# Patient Record
Sex: Male | Born: 1990 | Race: Black or African American | Hispanic: No | Marital: Single | Smoking: Never smoker
Health system: Southern US, Community
[De-identification: ages and names within clinical notes are randomized; demographics above are authoritative.]

## PROBLEM LIST (undated history)

## (undated) DIAGNOSIS — R140 Abdominal distension (gaseous): Secondary | ICD-10-CM

## (undated) DIAGNOSIS — F913 Oppositional defiant disorder: Secondary | ICD-10-CM

## (undated) DIAGNOSIS — F909 Attention-deficit hyperactivity disorder, unspecified type: Secondary | ICD-10-CM

## (undated) DIAGNOSIS — K580 Irritable bowel syndrome with diarrhea: Secondary | ICD-10-CM

## (undated) DIAGNOSIS — L309 Dermatitis, unspecified: Secondary | ICD-10-CM

## (undated) HISTORY — DX: Oppositional defiant disorder: F91.3

## (undated) HISTORY — DX: Attention-deficit hyperactivity disorder, unspecified type: F90.9

## (undated) HISTORY — DX: Abdominal distension (gaseous): R14.0

## (undated) HISTORY — DX: Dermatitis, unspecified: L30.9

## (undated) HISTORY — DX: Irritable bowel syndrome with diarrhea: K58.0

---
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
## 2009-12-22 ENCOUNTER — Ambulatory Visit: Payer: Self-pay | Admitting: Family Medicine

## 2011-03-12 ENCOUNTER — Encounter: Payer: Self-pay | Admitting: Family Medicine

## 2011-11-03 ENCOUNTER — Encounter: Payer: Self-pay | Admitting: Medical

## 2011-11-03 ENCOUNTER — Other Ambulatory Visit: Payer: Self-pay | Admitting: Medical

## 2011-11-03 ENCOUNTER — Ambulatory Visit (INDEPENDENT_AMBULATORY_CARE_PROVIDER_SITE_OTHER): Payer: BC Managed Care – PPO | Admitting: Medical

## 2011-11-03 VITALS — BP 110/80 | HR 88 | Temp 98.2°F | Resp 16 | Wt 138.0 lb

## 2011-11-03 DIAGNOSIS — Z113 Encounter for screening for infections with a predominantly sexual mode of transmission: Secondary | ICD-10-CM

## 2011-11-03 DIAGNOSIS — R531 Weakness: Secondary | ICD-10-CM

## 2011-11-03 DIAGNOSIS — R5383 Other fatigue: Secondary | ICD-10-CM

## 2011-11-03 DIAGNOSIS — R202 Paresthesia of skin: Secondary | ICD-10-CM

## 2011-11-03 DIAGNOSIS — R631 Polydipsia: Secondary | ICD-10-CM

## 2011-11-03 DIAGNOSIS — R209 Unspecified disturbances of skin sensation: Secondary | ICD-10-CM

## 2011-11-03 LAB — POCT URINALYSIS DIPSTICK
Blood, UA: NEGATIVE
Nitrite, UA: NEGATIVE
pH, UA: 5

## 2011-11-03 LAB — CBC WITH DIFFERENTIAL/PLATELET
Eosinophils Relative: 4 % (ref 0–5)
HCT: 45.9 % (ref 39.0–52.0)
Hemoglobin: 16 g/dL (ref 13.0–17.0)
Lymphocytes Relative: 21 % (ref 12–46)
Lymphs Abs: 2 10*3/uL (ref 0.7–4.0)
MCV: 82.4 fL (ref 78.0–100.0)
Monocytes Absolute: 0.9 10*3/uL (ref 0.1–1.0)
Monocytes Relative: 9 % (ref 3–12)
RBC: 5.57 MIL/uL (ref 4.22–5.81)
WBC: 9.4 10*3/uL (ref 4.0–10.5)

## 2011-11-03 LAB — COMPREHENSIVE METABOLIC PANEL
Alkaline Phosphatase: 60 U/L (ref 39–117)
Creat: 1.13 mg/dL (ref 0.50–1.35)
Glucose, Bld: 94 mg/dL (ref 70–99)
Sodium: 138 mEq/L (ref 135–145)
Total Bilirubin: 0.8 mg/dL (ref 0.3–1.2)
Total Protein: 8.1 g/dL (ref 6.0–8.3)

## 2011-11-03 LAB — VITAMIN B12: Vitamin B-12: 502 pg/mL (ref 211–911)

## 2011-11-03 NOTE — Progress Notes (Signed)
Subjective: Here for multiple concerns.  He is worried about changes and symptoms he has been experiencing in the last month.  He notes increased thirst, decreased urine output, night sweats, mouth gets dry, muscles feel weak, gets shaking at times, feels off balance and dizzy, gets tingling in his toes, blurred vision, and focus seems off.   He normally grazes throughout the day, but at least 2 meals daily.  He has lost weight unexpectedly.  He owns a pressure washing business.  Other people have commented on him not being himself, looking fatigued or tired.   He is concerned as their are family members with diabetes and Lupus.    He is sexually active, has multiple partners.  Last STD screening "a while ago." denies penile discharge, sores, pain, urinary burning.    Past Medical History  Diagnosis Date  . ADHD (attention deficit hyperactivity disorder)   . Eczema   . Oppositional defiant disorder    Family medical history: Maternal GM with lupus, maternal aunt and uncle with diabetes.  Parents are healthy.  He is an only child.   Review of Systems Constitutional: +fever, -chills, -sweats, +unexpected weight loss, +anorexia, +fatigue Allergy: -rash, -sneezing, -itching, -congestion Dermatology: denies changing moles, rash, lumps, new worrisome lesions ENT: -runny nose, -ear pain, -sore throat, -hoarseness, -sinus pain, -teeth pain, -tinnitus, -hearing loss, -epistaxis Cardiology:  -chest pain, -palpitations, -edema, -orthopnea, -paroxysmal nocturnal dyspnea Respiratory: some throat clearing; -cough, -shortness of breath, -dyspnea on exertion, -wheezing, -hemoptysis Gastroenterology: -abdominal pain, -nausea, -vomiting, +occasional diarrhea, -constipation, -blood in stool, -changes in bowel movement, -dysphagia Hematology: -bleeding or bruising problems Musculoskeletal: -arthralgias, -myalgias, -joint swelling, +low back pain, -neck pain, -cramping, -gait changes Ophthalmology: +vision  changes, -eye redness, -itching, -discharge Urology: +decreased urination, -dysuria, -difficulty urinating, -hematuria, -urinary frequency, -urgency, incontinence Neurology: -headache, -weakness, -tingling, +some numbness in toes, -speech abnormality, -memory loss, -falls, -dizziness Psychology:  +some temper of recent; -depressed mood, -agitation, -sleep problems   The following portions of the patient's history were reviewed and updated as appropriate: allergies, current medications, past family history, past medical history, past social history, past surgical history and problem list.    Objective:   Physical Exam  General appearance: alert, no distress, WD/WN, lean AA male Skin: numerous tattoos on abdomen and arms, no worrisome lesions HEENT: normocephalic, sclerae anicteric, TMs pearly, nares patent, no discharge or erythema, pharynx normal Oral cavity: MMM, no lesions Neck: supple, no lymphadenopathy, no thyromegaly, no masses Heart: RRR, normal S1, S2, no murmurs Lungs: CTA bilaterally, no wheezes, rhonchi, or rales Abdomen: +bs, soft, non tender, non distended, no masses, no hepatomegaly, no splenomegaly Pulses: 2+ symmetric, upper and lower extremities, normal cap refill Neuro: CN2-12 intact, normal sensation, strength and DTRs, nonfocal exam, no muscle fatigability GU: normal male genitalia, small white penile discharge, otherwise no mass, hernia, or skin lesions   Assessment and Plan :     Encounter Diagnoses  Name Primary?  . Fatigue Yes  . Weakness   . Polydipsia   . Paresthesia   . Screen for STD (sexually transmitted disease)    Advised that his symptoms are numerous and could represent a variety of processes.   We will check labs today to help evaluate and rule out several things hopefully.  I did advise though that given multiple sexual partners and the obvious penile discharge, he potentially has an STD.   We will call with labs and plan.

## 2011-11-04 ENCOUNTER — Other Ambulatory Visit: Payer: Self-pay | Admitting: Medical

## 2011-11-04 DIAGNOSIS — R7989 Other specified abnormal findings of blood chemistry: Secondary | ICD-10-CM

## 2011-11-04 LAB — GC/CHLAMYDIA PROBE AMP, GENITAL
Chlamydia, DNA Probe: NEGATIVE
GC Probe Amp, Genital: NEGATIVE

## 2011-11-04 LAB — T3: T3, Total: 82.5 ng/dL (ref 80.0–204.0)

## 2011-11-04 LAB — RPR

## 2011-11-04 NOTE — Progress Notes (Signed)
Addended by: Leretha Dykes L on: 11/04/2011 11:08 AM   Modules accepted: Orders

## 2011-11-05 ENCOUNTER — Encounter: Payer: Self-pay | Admitting: Family Medicine

## 2011-11-05 ENCOUNTER — Ambulatory Visit
Admission: RE | Admit: 2011-11-05 | Discharge: 2011-11-05 | Disposition: A | Discharge status: Home / Self Care | Payer: BC Managed Care – PPO | Source: Ambulatory Visit | Attending: Family Medicine | Admitting: Family Medicine

## 2011-11-05 ENCOUNTER — Ambulatory Visit (INDEPENDENT_AMBULATORY_CARE_PROVIDER_SITE_OTHER): Payer: BC Managed Care – PPO | Admitting: Family Medicine

## 2011-11-05 VITALS — BP 110/70 | HR 120

## 2011-11-05 DIAGNOSIS — R509 Fever, unspecified: Secondary | ICD-10-CM

## 2011-11-05 DIAGNOSIS — R5383 Other fatigue: Secondary | ICD-10-CM

## 2011-11-05 DIAGNOSIS — R5381 Other malaise: Secondary | ICD-10-CM

## 2011-11-05 DIAGNOSIS — R634 Abnormal weight loss: Secondary | ICD-10-CM

## 2011-11-05 DIAGNOSIS — R946 Abnormal results of thyroid function studies: Secondary | ICD-10-CM

## 2011-11-05 DIAGNOSIS — R61 Generalized hyperhidrosis: Secondary | ICD-10-CM

## 2011-11-05 NOTE — Progress Notes (Signed)
  Subjective:    Patient ID: Hunter Estes, male    DOB: 1990-08-24, 21 y.o.   MRN: [id number]  HPI He is here for recheck. He continues to have trouble with fatigue, night sweats , fever and chills, weight loss, some cough and congestion with occasional diarrhea. He states that now his symptoms started approximately 2-3 months ago with cough and have progressed since then. He states it is closer fitting him very loosely and in fact he has had to take several masses in his belt loop.   Review of Systems     Objective:   Physical Exam alert and in no distress. Tympanic membranes and canals are normal. Throat is clear. Tonsils are normal. Neck is supple without adenopathy or thyromegaly. Cardiac exam shows a regular sinus rhythm without murmurs or gallops. Lungs are clear to auscultation. No adenopathy noted in axilla or groin. Abdominal exam shows no masses or tenderness. Review of his record does indicate slightly diminished thyroid function. Urine microscopic did show renal tubular cells and occasional white cell. Prostate exam was normal.       Assessment & Plan:   1. Night sweats  DG Chest 2 View, TB Skin Test  2. Fever and chills  DG Chest 2 View  3. Weight loss  DG Chest 2 View  4. Fatigue  DG Chest 2 View  5. Abnormal thyroid function test  Thyroid peroxidase antibody

## 2011-11-06 LAB — URINE CULTURE: Organism ID, Bacteria: NO GROWTH

## 2011-11-08 LAB — THYROID PEROXIDASE ANTIBODY: Thyroperoxidase Ab SerPl-aCnc: <10 IU/mL (ref <35.0)

## 2011-11-09 ENCOUNTER — Encounter: Payer: Self-pay | Admitting: Family Medicine

## 2011-11-09 ENCOUNTER — Ambulatory Visit (INDEPENDENT_AMBULATORY_CARE_PROVIDER_SITE_OTHER): Payer: BC Managed Care – PPO | Admitting: Family Medicine

## 2011-11-09 VITALS — BP 120/80 | HR 74 | Wt 143.0 lb

## 2011-11-09 DIAGNOSIS — N419 Inflammatory disease of prostate, unspecified: Secondary | ICD-10-CM

## 2011-11-09 LAB — TB SKIN TEST
Induration: 72 mm
TB Skin Test: NEGATIVE

## 2011-11-09 MED ORDER — DOXYCYCLINE HYCLATE 100 MG PO TABS: 100.0000 mg | ORAL_TABLET | Freq: Two times a day (BID) | ORAL | Status: AC

## 2011-11-09 NOTE — Progress Notes (Signed)
  Subjective:    Patient ID: Hunter Estes, male    DOB: 12-Dec-1990, 21 y.o.   MRN: [id number]  HPI He is here for recheck. He states that his appetite is slightly better but he still having some difficulty with urinary decreased stream. He is also noted some erectile difficulties. On his previous evaluation a slight discharge was noted although the [facility] including culture was negative. His prostate exam was equivocal.   Review of Systems     Objective:   Physical Exam Alert and in no distress. Genital exam shows normal penis and testes with no discharge.      Assessment & Plan:   1. Prostatitis    his symptoms are very vague but could possibly be a low-grade prostatitis causing some this. I decided to treat him with doxycycline. He will let me know if this helps his symptoms.

## 2011-11-09 NOTE — Patient Instructions (Signed)
Call me back in 10 days and let me know how you're doing

## 2011-11-22 ENCOUNTER — Encounter: Payer: Self-pay | Admitting: Family Medicine

## 2011-11-22 ENCOUNTER — Ambulatory Visit (INDEPENDENT_AMBULATORY_CARE_PROVIDER_SITE_OTHER): Payer: BC Managed Care – PPO | Admitting: Family Medicine

## 2011-11-22 VITALS — BP 118/80 | HR 82 | Wt 146.0 lb

## 2011-11-22 DIAGNOSIS — R531 Weakness: Secondary | ICD-10-CM

## 2011-11-22 DIAGNOSIS — R5381 Other malaise: Secondary | ICD-10-CM

## 2011-11-22 NOTE — Progress Notes (Signed)
  Subjective:    Patient ID: Hunter Estes, male    DOB: [birth year], 21 y.o.   MRN: [id number]  HPI He is here for recheck. He states he still not quite back to his normal self. He has noted some blurred vision mainly left eye that tends to come and go. He does complain of continued difficulty with shaking in his hands as well as a numb feeling in his toes bilaterally. He also complains of generalized weakness. He also complains of continued difficulty with erections. He also states his stream seems to be weaker.   Review of Systems     Objective:   Physical Exam alert and in no distress. Tympanic membranes and canals are normal. Throat is clear. Tonsils are normal. Neck is supple without adenopathy or thyromegaly. Cardiac exam shows a regular sinus rhythm without murmurs or gallops. Lungs are clear to auscultation. DTRs normal. No hand tremor noted.        Assessment & Plan:  The etiology of his symptoms is unclear. I will refer to rheumatology

## 2011-12-07 ENCOUNTER — Telehealth: Payer: Self-pay | Admitting: Family Medicine

## 2011-12-07 NOTE — Telephone Encounter (Signed)
Pt informed and faxed info over to Deveshwar at [facility] [phone] fax [phone]

## 2011-12-07 NOTE — Telephone Encounter (Signed)
Send him to a rheumatologist

## 2011-12-17 ENCOUNTER — Encounter: Payer: Self-pay | Admitting: Medical

## 2011-12-17 ENCOUNTER — Ambulatory Visit (INDEPENDENT_AMBULATORY_CARE_PROVIDER_SITE_OTHER): Payer: BC Managed Care – PPO | Admitting: Medical

## 2011-12-17 VITALS — BP 122/88 | HR 88 | Temp 98.2°F | Resp 16 | Wt 141.0 lb

## 2011-12-17 DIAGNOSIS — S81029A Laceration with foreign body, unspecified knee, initial encounter: Secondary | ICD-10-CM

## 2011-12-17 DIAGNOSIS — S81809A Unspecified open wound, unspecified lower leg, initial encounter: Secondary | ICD-10-CM

## 2011-12-17 MED ORDER — AMOXICILLIN-POT CLAVULANATE 875-125 MG PO TABS: 1.0000 | ORAL_TABLET | Freq: Two times a day (BID) | ORAL | Status: AC
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
## 2011-12-24 ENCOUNTER — Ambulatory Visit (INDEPENDENT_AMBULATORY_CARE_PROVIDER_SITE_OTHER): Payer: BC Managed Care – PPO | Admitting: Medical

## 2011-12-24 ENCOUNTER — Ambulatory Visit: Payer: BC Managed Care – PPO | Admitting: Medical

## 2011-12-24 ENCOUNTER — Encounter: Payer: Self-pay | Admitting: Medical

## 2011-12-24 VITALS — BP 120/86 | HR 80 | Temp 98.6°F | Resp 18 | Wt 142.0 lb

## 2011-12-24 DIAGNOSIS — S81019A Laceration without foreign body, unspecified knee, initial encounter: Secondary | ICD-10-CM

## 2011-12-24 DIAGNOSIS — Z4802 Encounter for removal of sutures: Secondary | ICD-10-CM

## 2011-12-24 DIAGNOSIS — S91009A Unspecified open wound, unspecified ankle, initial encounter: Secondary | ICD-10-CM

## 2011-12-24 DIAGNOSIS — S81009A Unspecified open wound, unspecified knee, initial encounter: Secondary | ICD-10-CM

## 2011-12-24 NOTE — Progress Notes (Signed)
Subjective: Here for suture removal and recheck.  I saw him on 12/17/11 for wound to both knees, fell on glass.  We cleaned the knees, irrigated the wounds, and I placed 3 sutures.  Here is taking the antibiotic, no additional pus drainage, no fever, feeling fine.  The knees both have scabs now  Objective: Gen: wd, wn Skin: left knee with healing abrasions, scabs present, right knee similarly healing, with serosanguinous crust at sites of prior lacerations.  The 3 stellate lesions have healed back nicely with no open wounds or drainage currently.  1 interrupted suture present at each of the 3 stellate wounds of the right knee  Assessment: Encounter Diagnoses  Name Primary?  . Laceration of knee Yes  . Visit for suture removal     Plan: Used saline gauze to massage the scabs over the wounds as the sutures were buried in the crusting.   Then cleaned area with betadine and removed the 3 interrupted sutures.  Wounds healing appropriately.  Advised he finish antibiotic, discussed ongoing wound care until they completley heal.

## 2012-01-27 ENCOUNTER — Telehealth: Payer: Self-pay | Admitting: Family Medicine

## 2012-01-27 NOTE — Telephone Encounter (Signed)
LM

## 2012-02-02 ENCOUNTER — Ambulatory Visit (INDEPENDENT_AMBULATORY_CARE_PROVIDER_SITE_OTHER): Payer: BC Managed Care – PPO | Admitting: Family Medicine

## 2012-02-02 ENCOUNTER — Encounter: Payer: Self-pay | Admitting: Family Medicine

## 2012-02-02 VITALS — BP 124/80 | HR 74 | Wt 150.0 lb

## 2012-02-02 DIAGNOSIS — F39 Unspecified mood [affective] disorder: Secondary | ICD-10-CM

## 2012-02-02 DIAGNOSIS — R4586 Emotional lability: Secondary | ICD-10-CM

## 2012-02-02 NOTE — Patient Instructions (Signed)
Call Dr. Andee Poles [phone] and set up an appointment. Let me know when it is and I will send my information to her.

## 2012-02-02 NOTE — Progress Notes (Signed)
  Subjective:    Patient ID: Hunter Estes, male    DOB: Oct 04, 1990, 21 y.o.   MRN: [id number]  HPI He is here for followup visit. He was seen by Dr. Corliss Skains recently. She did call me concerning his care. Her evaluation was essentially negative. He is here for a recheck. He complains of multiple issues including intermittent blurred vision, a one-month history of noting an increased heart rate with some chest pain that is intermittent as well. He did complain of weight loss but says that recently he has gained his weight back. He also states that he sweats quite easily. He's noted some difficulty with urinary dripping. He also notes questionable hair loss as well as symptoms of gastrocolic reflex. He has a previous history of ADHD and oppositional defiant disorder. At the end of the encounter he then mentioned that over the last month or so his mother and girlfriend are both noted mood swings. He then stated that his moods can swing from minute to minute.   Review of Systems     Objective:   Physical Exam Alert and in no distress with appropriate affect. DTRs are normal. EOMI. Cerebellar normal. Normal finger to nose.       Assessment & Plan:   1. Mood swings    I discussed the fact that his symptoms are not consistent with anything in particular and psychological is certainly an option. He was comfortable with this. He was given the phone number for Dr. Andee Poles and will let me know any has an appointment selected send her the information. He was comfortable with this. 30 minutes spent discussing this with him

## 2012-03-23 ENCOUNTER — Ambulatory Visit: Payer: BC Managed Care – PPO | Admitting: Family Medicine

## 2012-03-28 ENCOUNTER — Encounter: Payer: Self-pay | Admitting: Family Medicine

## 2012-03-28 ENCOUNTER — Ambulatory Visit (INDEPENDENT_AMBULATORY_CARE_PROVIDER_SITE_OTHER): Payer: BC Managed Care – PPO | Admitting: Family Medicine

## 2012-03-28 VITALS — BP 122/88 | HR 100 | Wt 146.0 lb

## 2012-03-28 DIAGNOSIS — H538 Other visual disturbances: Secondary | ICD-10-CM

## 2012-03-28 NOTE — Progress Notes (Signed)
  Subjective:    Patient ID: Hunter Estes, male    DOB: Oct 07, 1990, 21 y.o.   MRN: [id number]  HPI He is here for consultation concerning continued difficulty with rapid heart rate, difficulty urinating as well as having BM's, tremors in his hands, blurred vision mainly on the left. He also complains of abdominal distress. He has been seen by rheumatology    Review of Systems     Objective:   Physical Exam alert and in no distress with appropriate affect. Tympanic membranes and canals are normal. Throat is clear. Tonsils are normal. Neck is supple without adenopathy or thyromegaly. Cardiac exam shows a regular sinus rhythm without murmurs or gallops. Lungs are clear to auscultation. DTRs normal. No clonus noted.       Assessment & Plan:  The etiology of his symptoms is unclear. MS is a possibility as is psychological overlay. 1. Blurred vision, left eye  MR Brain W Wo Contrast

## 2012-03-28 NOTE — Progress Notes (Signed)
Pt has an appt with [facility] @ [address] [place] [address] on Thursday October 31,2013 @ 7:30pm for MRI. auth # [id number]

## 2012-03-30 ENCOUNTER — Other Ambulatory Visit: Payer: BC Managed Care – PPO

## 2012-04-03 ENCOUNTER — Ambulatory Visit
Admission: RE | Admit: 2012-04-03 | Discharge: 2012-04-03 | Disposition: A | Discharge status: Home / Self Care | Payer: BC Managed Care – PPO | Source: Ambulatory Visit | Attending: Family Medicine | Admitting: Family Medicine

## 2012-04-03 ENCOUNTER — Encounter: Payer: Self-pay | Admitting: Family Medicine

## 2012-04-03 DIAGNOSIS — H538 Other visual disturbances: Secondary | ICD-10-CM

## 2012-04-03 MED ORDER — GADOBENATE DIMEGLUMINE 529 MG/ML IV SOLN
14.0000 mL | Freq: Once | INTRAVENOUS | Status: AC | PRN
  Administered 2012-04-03: 14 mL via INTRAVENOUS

## 2012-04-04 NOTE — Progress Notes (Signed)
Quick Note:  I called the patient. Schedule him to see neurosurgery. ______

## 2012-05-31 HISTORY — PX: OTHER SURGICAL HISTORY: SHX169

## 2012-06-13 ENCOUNTER — Telehealth: Payer: Self-pay | Admitting: Family Medicine

## 2012-06-13 NOTE — Telephone Encounter (Signed)
Called pt and informed him word for word and pt verbalized understanding

## 2012-06-13 NOTE — Telephone Encounter (Signed)
MOM MADE APPT ON 1/23 WITH DR Susann Givens DISCUSS THE POSSIBILITY OF PT HAVING MS BUT WANTS TO KNOW IF [facility] WILL TEST FOR THAT HERE IN THE OFFICE OR IF PT WILL HAVE TO GO SOMEWHERE ELSE FOR THAT TESTING. IF THEY WILL HAVE TO GO SOMEWHERE ELSE, IS THERE A POSSIBILITY THAT DR [facility]ON[facility] MAY SEE A NEED TO TEST FOR THIS BEFORE THAT APPOINTMENT SO THE RESULTS CAN BE DISCUSSED AT THE APPOINTMENT. OR DO THEY NEED TO GO TO NEUROLOGY?

## 2012-06-13 NOTE — Telephone Encounter (Signed)
Spleen to her that there really is not a blood test for MS. The MRI that he had is one way we evaluate for MS. We can discuss further options when he comes in.

## 2012-06-22 ENCOUNTER — Encounter: Payer: Self-pay | Admitting: Family Medicine

## 2012-06-22 ENCOUNTER — Ambulatory Visit (INDEPENDENT_AMBULATORY_CARE_PROVIDER_SITE_OTHER): Payer: BC Managed Care – PPO | Admitting: Family Medicine

## 2012-06-22 VITALS — BP 118/80 | HR 80 | Wt 152.0 lb

## 2012-06-22 DIAGNOSIS — R5383 Other fatigue: Secondary | ICD-10-CM

## 2012-06-22 DIAGNOSIS — R197 Diarrhea, unspecified: Secondary | ICD-10-CM

## 2012-06-22 DIAGNOSIS — R109 Unspecified abdominal pain: Secondary | ICD-10-CM

## 2012-06-22 NOTE — Progress Notes (Signed)
  Subjective:    Patient ID: Hunter Estes, male    DOB: 1990/11/28, 22 y.o.   MRN: [id number]  HPI He is here for followup visit. He has concerns over possibly having MS. He has had an evaluation by myself, rheumatology, neurology, neuro-ophthalmology. So far no particular diagnosis has been made for his underlying problem. Most recently he has had difficulty with abdominal pain, bloating, excessive flatulence with him diarrhea. He cannot relate this to any particular foods. He continues to complain of fatigue with intermittent palpitations.   Review of Systems     Objective:   Physical Exam Alert and in no distress otherwise not examined       Assessment & Plan:   1. Abdominal  pain, other specified site  Celiac panel  2. Fatigue  Celiac panel  3. Diarrhea  Celiac panel   so far the exact etiology behind all of his symptoms has been unobtainable. I will further assess his GI symptoms. May need to pursue further evaluation of his palpitations but not at the present time.

## 2012-06-23 NOTE — Progress Notes (Signed)
Quick Note:  Have him set up an appointment to discuss celiac disease ______

## 2012-06-23 NOTE — Progress Notes (Signed)
Quick Note:  PT HAS APT JAN 28 TO COME IN AND DISCUSS LABS ______

## 2012-06-27 ENCOUNTER — Ambulatory Visit (INDEPENDENT_AMBULATORY_CARE_PROVIDER_SITE_OTHER): Payer: BC Managed Care – PPO | Admitting: Family Medicine

## 2012-06-27 DIAGNOSIS — K900 Celiac disease: Secondary | ICD-10-CM

## 2012-06-27 NOTE — Progress Notes (Signed)
  Subjective:    Patient ID: Hunter Estes, male    DOB: 1990-10-25, 22 y.o.   MRN: [id number]  HPI He is here for consultation concerning recent lab work which did show evidence of celiac disease. His most recent visit did demonstrate GI symptoms. Prior to this he did have a plethora of other symptoms that might possibly be related to this.   Review of Systems     Objective:   Physical Exam Alert and in no distress otherwise not examined       Assessment & Plan:   1. Celiac disease    information concerning celiac disease was given to him. Is also recommended that he go to various websites to get more information concerning celiac disease. I also discussed the fact that some of the other symptoms and perhaps all of the previous symptoms that when he was being evaluated for a potentially go away. He is to return here in one month for recheck. 15 minutes spent discussing all these issues with him.

## 2012-06-27 NOTE — Patient Instructions (Signed)
Celiac Disease Celiac disease is a digestive disease that causes your body's natural defense system (immune system) to react against its own cells. It interferes with taking in (absorbing) nutrients from food. Celiac disease is also known as celiac sprue, nontropical sprue, and gluten-sensitive enteropathy. People who have celiac disease cannot tolerate gluten. Gluten is a protein found in wheat, rye, and barley. With time, celiac disease will damage the cells lining the small intestine. This leads to being unable to absorb nutrients from food (malabsorption), diarrhea, and nutritional problems. CAUSES  Celiac disease is genetic. This means you have a higher likelihood of getting the disease if someone in your family has or has had it. Up to 10% of your close relatives (parent, sibling, child) may also have the disease.  People with celiac disease tend to have other autoimmune diseases. The link may be genetic. These diseases include:  Dermatitis herpetiformis.  Thyroid disease.  Systemic lupus erythematosis.  Type 1 diabetes.  Liver disease.  Collagen vascular disease.  Rheumatoid arthritis.  Sjgren's syndrome. SYMPTOMS  The symptoms of celiac disease vary from person to person. The symptoms are generally digestive or nutritional. Digestive symptoms include:  Recurring belly (abdominal) bloating and pain.  Gas.  Long-term (chronic) diarrhea.  Pale, bad-smelling, greasy, or oily stool. Nutritional symptoms include:  Failure to thrive in infants.  Delayed growth in children.  Weight loss in children and adults.  Missed menstrual periods (often due to extreme weight loss).  Anemia.  Weakening bones (osteoporosis).  Fatigue and weakness.  Tingling or other signs of nerve damage (peripheral neuropathy).  Depression. DIAGNOSIS  If your symptoms and physical exam suggest that a digestive disorder or malnutrition is present, your caregiver may suspect celiac disease. You  may have already begun a gluten-free diet. If symptoms persist, testing may be needed to confirm the diagnosis. Some tests are best done while you are on a normal, unrestricted diet. Tests may include:  Blood tests to check for nutritional deficiencies.  Blood tests to look for evidence that the body is producing antibodies against its own small intestine cells.  Taking a tissue sample (biopsy) from the small bowel for evaluation.  X-rays of the small bowel.  Evaluating the stool for fat.  Tests to check for nutrient absorption from the intestine. TREATMENT  It is important to seek treatment. Untreated celiac disease can cause growth problems (in children), anemia, osteoporosis, and possible nerve problems. A pregnant patient with untreated celiac disease has a higher risk of miscarriage, and the fetus has an increased risk of low birth weight and other growth problems. If celiac disease is diagnosed in the early stages, treatment can allow you to live a long, nearly symptom-free life. Treatment includes following a gluten-free diet. This means avoiding all foods that contain gluten. Eating even a small amount of gluten can damage your intestine. For most people, following this diet will stop symptoms. It will heal existing intestinal damage and prevent further damage. Improvements begin within days of starting the diet. The small intestine is usually completely healed within 3 to 6 months, or it may take up to 2 years for older adults. A small percentage of people do not improve on the gluten-free diet. Depending on your age and the stage at which you were diagnosed, some problems such as delayed growth and discolored teeth may not improve. Sometimes, damaged intestines cannot heal. If your intestines are not absorbing enough nutrients, you may need to receive nutrition supplements through an intravenous (IV) tube.  Drug treatments are being tested for unresponsive celiac disease. In this case, you  may need to be evaluated for complications of the disease. Your caregiver may also recommend:  A pneumonia vaccination.  Nutrients and other treatments for any nutritional deficiencies. Your caregiver can provide you with more information on a gluten-free diet. Discussion with a dietician skilled in this illness will be valuable. Support groups may also be helpful. HOME CARE INSTRUCTIONS   Focus on a gluten-free diet. This diet must become a way of life.  Monitor your response to the gluten-free diet and treat any nutritional deficiencies.  Prepare ahead of time if you decide to eat outside the home.  Make and keep your regular follow-up visits with your caregiver.  Suggest to family members that they get screened for early signs of the disease. SEEK MEDICAL CARE IF:   You continue to have digestive symptoms (gas, cramping, diarrhea) despite a proper diet.  You have trouble sticking to the gluten-free diet.  You develop an itchy rash with groups of tiny blisters.  You develop severe weakness, balance problems, menstrual problems, or depression. Document Released: 05/17/2005 Document Revised: 08/09/2011 Document Reviewed: 09/03/2009 [facility] Patient Information 2013 [place], [place]. Gluten intolerance a group.

## 2012-09-12 ENCOUNTER — Telehealth: Payer: Self-pay | Admitting: Family Medicine

## 2012-09-12 ENCOUNTER — Encounter: Payer: Self-pay | Admitting: Medical

## 2012-09-12 ENCOUNTER — Ambulatory Visit (INDEPENDENT_AMBULATORY_CARE_PROVIDER_SITE_OTHER): Payer: BC Managed Care – PPO | Admitting: Medical

## 2012-09-12 VITALS — BP 120/80 | HR 68 | Temp 98.4°F | Resp 16 | Wt 146.0 lb

## 2012-09-12 DIAGNOSIS — S62339A Displaced fracture of neck of unspecified metacarpal bone, initial encounter for closed fracture: Secondary | ICD-10-CM

## 2012-09-12 DIAGNOSIS — S6991XA Unspecified injury of right wrist, hand and finger(s), initial encounter: Secondary | ICD-10-CM

## 2012-09-12 DIAGNOSIS — S6990XA Unspecified injury of unspecified wrist, hand and finger(s), initial encounter: Secondary | ICD-10-CM

## 2012-09-12 DIAGNOSIS — S62309A Unspecified fracture of unspecified metacarpal bone, initial encounter for closed fracture: Secondary | ICD-10-CM

## 2012-09-12 NOTE — Telephone Encounter (Signed)
Message copied by Liara Holm L on Tue Sep 12, 2012  4:28 PM ------      Message from: Aleen Campi, DAVID S      Created: Tue Sep 12, 2012  3:13 PM       pls refer to whoever can get him in today for clinical dx of right hand boxer's fracture.   They can do xrays, as I am 99% certain based on clinical finding of a fracture.  ------
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
## 2012-09-12 NOTE — Telephone Encounter (Signed)
Patient is aware to go to the after hours clinic for [facility] hours 530 to 9 pm [phone]. CLS

## 2012-10-05 ENCOUNTER — Ambulatory Visit (INDEPENDENT_AMBULATORY_CARE_PROVIDER_SITE_OTHER): Payer: BC Managed Care – PPO | Admitting: Family Medicine

## 2012-10-05 ENCOUNTER — Encounter: Payer: Self-pay | Admitting: Family Medicine

## 2012-10-05 VITALS — BP 110/80 | HR 80 | Wt 147.0 lb

## 2012-10-05 DIAGNOSIS — K900 Celiac disease: Secondary | ICD-10-CM

## 2012-10-05 DIAGNOSIS — R002 Palpitations: Secondary | ICD-10-CM

## 2012-10-05 DIAGNOSIS — K5900 Constipation, unspecified: Secondary | ICD-10-CM

## 2012-10-05 MED ORDER — POLYETHYLENE GLYCOL 3350 17 GM/SCOOP PO POWD: 17.0000 g | Freq: Two times a day (BID) | ORAL | Status: DC | PRN

## 2012-10-05 NOTE — Patient Instructions (Signed)
Use this medication daily for the next week or 2 to help make you more regular. If this does not work,I will get you an appointment with the stomach specialist

## 2012-10-05 NOTE — Progress Notes (Signed)
  Subjective:    Patient ID: Hunter Estes, male    DOB: 1991/04/05, 21 y.o.   MRN: [id number]  HPI He has a two-week history of intermittent lower abdominal pain that occurs every 2 or 3 days. There is no association with urinary or bowel habits.  He changed his eating habits in January when the diagnosis of celiac disease was made and since then his bowel habits have decreased now having 2 per week and he states they are quite hard. He says that he has a hard time evacuating. He also continues to have difficulty with palpitations but these are separate and have been going on for several months.   Review of Systems     Objective:   Physical Exam Alert and in no distress. Cardiac and lung exam were not done. Abdominal exam showed active bowel sounds without masses and some lower abdominal tenderness. Rectal exam showed firm stool present which was guaiac negative       Assessment & Plan:  Constipation - Plan: polyethylene glycol powder (GLYCOLAX/MIRALAX) powder  Palpitations - Plan: EKG 12-Lead recommend plenty of fluids as well as a MiraLax. If continued difficulty, GI evaluation will be made. No particular evaluation a workup for palpitations at this time. Celiac disease

## 2012-10-16 ENCOUNTER — Telehealth: Payer: Self-pay

## 2012-10-16 NOTE — Telephone Encounter (Signed)
MOM CALLED AND SAID PT WAS DX WITH CELIAC AND WAS WANTING REFERRAL TO GI FOR THIS IS THIS OK PLEASE ADVISE

## 2012-10-16 NOTE — Telephone Encounter (Signed)
Not sure why he wants a referral. If he is doing well, nothing else needs to be done. There is no cure just avoidance. Find out what his concern is.

## 2012-10-29 HISTORY — PX: COLONOSCOPY: SHX174

## 2012-11-02 ENCOUNTER — Encounter: Payer: Self-pay | Admitting: Internal Medicine

## 2012-11-02 LAB — HM COLONOSCOPY: HM Colonoscopy: NORMAL

## 2012-11-07 ENCOUNTER — Telehealth: Payer: Self-pay | Admitting: Internal Medicine

## 2012-11-07 NOTE — Telephone Encounter (Signed)
Pt mom calling stating she would like a referral to a cardiologist. Pt is having weakness,chest pain,slow heart rate, palpalations,

## 2012-11-07 NOTE — Telephone Encounter (Signed)
Let him know that he needs to come in here first to get some limited data run.

## 2012-11-09 ENCOUNTER — Ambulatory Visit: Payer: BC Managed Care – PPO | Admitting: Family Medicine

## 2012-11-17 ENCOUNTER — Telehealth: Payer: Self-pay | Admitting: Family Medicine

## 2012-11-17 NOTE — Telephone Encounter (Signed)
LM

## 2012-11-20 ENCOUNTER — Ambulatory Visit (INDEPENDENT_AMBULATORY_CARE_PROVIDER_SITE_OTHER): Payer: BC Managed Care – PPO | Admitting: Family Medicine

## 2012-11-20 DIAGNOSIS — R002 Palpitations: Secondary | ICD-10-CM

## 2012-11-20 NOTE — Progress Notes (Signed)
  Subjective:    Patient ID: Hunter Estes, male    DOB: 07/03/1990, 22 y.o.   MRN: [id number]  HPI He complains of difficulty over the last 3 months with rapid heart rate and that sometimes he notes a slow heart rate. He also complains of chest pain and not relate this to the heart rate. One month ago while on a plane flight he became short of breath and clammy as well as having visual changes in an up and having, vision, passed out.he has had one other episode like this in both times he had a Bacardi rum prior to it.   Review of Systems     Objective:   Physical Exam Alert and in no distress. Cardiac exam shows a regular rhythm without murmurs or gallops. Lungs are clear to auscultation       Assessment & Plan:  Palpitations - Plan: Holter monitor - 24 hour

## 2012-11-23 ENCOUNTER — Ambulatory Visit: Payer: BC Managed Care – PPO | Admitting: *Deleted

## 2012-11-23 DIAGNOSIS — R002 Palpitations: Secondary | ICD-10-CM

## 2012-11-23 DIAGNOSIS — R0602 Shortness of breath: Secondary | ICD-10-CM

## 2012-11-23 NOTE — Progress Notes (Signed)
(  Dr. Susann Givens referral only)24 hour holter monitor applied. Verbal instructions given to patient. Patient and mother voiced understanding for the instructions, and left without questions.

## 2013-02-06 ENCOUNTER — Ambulatory Visit (INDEPENDENT_AMBULATORY_CARE_PROVIDER_SITE_OTHER): Payer: BC Managed Care – PPO | Admitting: Family Medicine

## 2013-02-06 VITALS — BP 120/80 | HR 85 | Wt 154.0 lb

## 2013-02-06 DIAGNOSIS — G4710 Hypersomnia, unspecified: Secondary | ICD-10-CM

## 2013-02-06 DIAGNOSIS — R002 Palpitations: Secondary | ICD-10-CM

## 2013-02-06 DIAGNOSIS — R079 Chest pain, unspecified: Secondary | ICD-10-CM

## 2013-02-06 DIAGNOSIS — R531 Weakness: Secondary | ICD-10-CM

## 2013-02-06 DIAGNOSIS — R5381 Other malaise: Secondary | ICD-10-CM

## 2013-02-06 LAB — CBC WITH DIFFERENTIAL/PLATELET
Basophils Absolute: 0 10*3/uL (ref 0.0–0.1)
Lymphocytes Relative: 31 % (ref 12–46)
Lymphs Abs: 1.9 10*3/uL (ref 0.7–4.0)
Neutro Abs: 3.2 10*3/uL (ref 1.7–7.7)
Neutrophils Relative %: 51 % (ref 43–77)
Platelets: 186 10*3/uL (ref 150–400)
RBC: 5.18 MIL/uL (ref 4.22–5.81)
RDW: 13.2 % (ref 11.5–15.5)
WBC: 6.1 10*3/uL (ref 4.0–10.5)

## 2013-02-06 LAB — COMPREHENSIVE METABOLIC PANEL
ALT: 13 U/L (ref 0–53)
AST: 16 U/L (ref 0–37)
CO2: 31 mEq/L (ref 19–32)
Calcium: 9.9 mg/dL (ref 8.4–10.5)
Chloride: 101 mEq/L (ref 96–112)
Creat: 1.07 mg/dL (ref 0.50–1.35)
Sodium: 138 mEq/L (ref 135–145)
Total Protein: 7.5 g/dL (ref 6.0–8.3)

## 2013-02-06 LAB — TSH: TSH: 0.899 u[IU]/mL (ref 0.350–4.500)

## 2013-02-06 NOTE — Progress Notes (Signed)
  Subjective:    Patient ID: Hunter Estes, male    DOB: 07-Nov-1990, 22 y.o.   MRN: [id number]  HPI He is here for evaluation of multiple issues. He has had difficulty with intermittent sharp chest pain and palpitations. He did have a Holter monitor however the result is not in the system. He also complains of intermittent left arm and leg pain that seem to be separate from each other. He describes weakness and numbness and then other times he can have pain. There is no history of injury. It can occur at any time. He also complains of excessive sleepiness now. He states that he is taking 3 naps per day. Since then occurring over the last several months. His record indicates he has had a plethora of other symptoms that have been evaluated and so far nothing has been identified. He has been seen by rheumatology, neurosurgery and by myself.   Review of Systems     Objective:   Physical Exam alert and in no distress. EOMI. Other cranial nerves grossly intact. Normal motor sensory and DTRs of his arms and legs. Normal cerebellar. Tympanic membranes and canals are normal. Throat is clear. Tonsils are normal. Neck is supple without adenopathy or thyromegaly. Cardiac exam shows a regular sinus rhythm without murmurs or gallops. Lungs are clear to auscultation. The Holter monitor was eventually retrieved and it was essentially negative.        Assessment & Plan:  Palpitations - Plan: CBC with Differential, Comprehensive metabolic panel, TSH  Chest pain  Weakness - Plan: CBC with Differential, Comprehensive metabolic panel, TSH, C-reactive protein, Sedimentation rate, Ambulatory referral to Neurology  Hypersomnia - Plan: CBC with Differential, Comprehensive metabolic panel, TSH, Ambulatory referral to Neurology  I explained that I could not Heasley all these symptoms and no one nice neat package. Explained that this could also be psychological. Discussed options. At this time our refer to neurology to  make sure not missing anything and then to psychiatry to look into this further.

## 2013-02-06 NOTE — Patient Instructions (Signed)
When you have palpitations, measure your heart rate and whether it is regular or irregular and then let me know

## 2013-02-07 NOTE — Progress Notes (Signed)
Quick Note:  PATIENT HAS BEEN INFORMED OF LABS AND APPOINTMENT WITH DR.JAFFE AT [facility] [address] AT 9 AM ______

## 2013-02-08 ENCOUNTER — Encounter: Payer: Self-pay | Admitting: Cardiology

## 2013-02-20 ENCOUNTER — Encounter: Payer: Self-pay | Admitting: Neurology

## 2013-02-20 ENCOUNTER — Ambulatory Visit (INDEPENDENT_AMBULATORY_CARE_PROVIDER_SITE_OTHER): Payer: BC Managed Care – PPO | Admitting: Neurology

## 2013-02-20 VITALS — BP 104/68 | HR 66 | Temp 98.1°F | Ht 68.0 in | Wt 147.0 lb

## 2013-02-20 DIAGNOSIS — R5381 Other malaise: Secondary | ICD-10-CM

## 2013-02-20 DIAGNOSIS — R259 Unspecified abnormal involuntary movements: Secondary | ICD-10-CM

## 2013-02-20 DIAGNOSIS — R251 Tremor, unspecified: Secondary | ICD-10-CM

## 2013-02-20 DIAGNOSIS — R531 Weakness: Secondary | ICD-10-CM

## 2013-02-20 NOTE — Patient Instructions (Addendum)
Your neurological exam is normal.  You do have a mild tremor in the hands but it does not seem something primarily neurological or consistent with a typical tremor.  I unfortunately do not have an explanation for your symptoms from a neurological perspective.

## 2013-02-20 NOTE — Progress Notes (Signed)
NEUROLOGY CONSULTATION NOTE  Hunter Estes MRN: [id number] DOB: [birth year]  Referring provider: Dr. Susann Givens Primary care provider: Dr. Susann Givens  Reason for consult:  Multiple symptomatology  HISTORY OF PRESENT ILLNESS: Hunter Estes is a 22 year old right-handed man who presents for evaluation of multiple symptoms.  Records and images were personally reviewed where available.    Onset started about a year ago.  He first noted "jitters" in the hands.  It can occur at any time, at rest or with action.  It occurs in either hand.  He does not notice any difference after having an alcoholic beverage.  It affects his ability to hold objects tightly.  Tremors do not run in the family.  He also notes jitters in the legs as well.  He also notes generalized weakness.  He says it involves all extremities, primarily distal (such as the calves).  He denies muscle pain.  He says he cannot stand too long, or else his legs start shaking and he sways.  He has not had any falls.  It is not worse any particular time of day and not worse with activity or inactivity.  He used to lift weights and can no longer participate.  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  He denies back pain shooting down the legs.    He also notes monocular blurred vision in the left eye.  There is no associated eye pain.  He also notes excessive somnolence during the day.  He sleep approximately 9-11 hours of uninterrupted sleep.  Sleep latency is short.  Other symptoms include paresthesia, palpitations, abdominal pain, shortness of breath and chest pain.    He had an MRI of the brain w/wo contrast performed on 03/28/12 for hand tremor and blurred vision of left eye.  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 Leanord Asal, neurosurgeon at [facility]  He was referred to neuro-ophthalmology, who did not find  any neuro-ophthalmic manifestations, although glaucoma was suspected.  It was determined by neurosurgery that his symptoms were not likely correlated with this lesion and recommended to observe.  He has also been evaluated by gastroenterology and rheumatology, with unremarkable workup.  He does report stress, in regards to relationships, going back to school, as well as financial (his job).  02/06/13: TSH 0.899, ESR 1, CRP <0.5, WBC 6.1, Hgb 15.5, HCT 43.7, PLT 186, Na 138, K 4.2, Cl 101, CO2 31, glucose 90, BUN 11, Cr 1.07, AP 63, AST 16, ALT 13, Ca 9.9.  PAST MEDICAL HISTORY: Past Medical History  Diagnosis Date  . ADHD (attention deficit hyperactivity disorder)   . Eczema   . Oppositional defiant disorder     PAST SURGICAL HISTORY: Past Surgical History  Procedure Laterality Date  . Right hand surgery  2014    MEDICATIONS: No current outpatient prescriptions on file prior to visit.   No current facility-administered medications on file prior to visit.    ALLERGIES: No Known Allergies  FAMILY HISTORY: Family History  Problem Relation Age of Onset  . Cancer Paternal Grandmother   . Hyperlipidemia Paternal Grandfather     SOCIAL HISTORY: History   Social History  . Marital Status: Single    Spouse Name: N/A    Number of Children: N/A  . Years of Education: N/A   Occupational History  . Not on file.  Social History Main Topics  . Smoking status: Never Smoker   . Smokeless tobacco: Never Used     Comment: smokes weed  . Alcohol Use: Yes     Comment: liquor on the weekends  . Drug Use: Yes     Comment: no marijuana for the past 2 month  . Sexual Activity: Yes   Other Topics Concern  . Not on file   Social History Narrative  . No narrative on file    REVIEW OF SYSTEMS: Constitutional: No fevers, chills, or sweats, no generalized fatigue, change in appetite Eyes: No visual changes, double vision, eye pain Ear, nose and throat: No hearing loss, ear pain,  nasal congestion, sore throat Cardiovascular: No chest pain, palpitations Respiratory:  No shortness of breath at rest or with exertion, wheezes GastrointestinaI: No nausea, vomiting, diarrhea, abdominal pain, fecal incontinence Genitourinary:  No dysuria, urinary retention or frequency Musculoskeletal:  No neck pain, back pain Integumentary: No rash, pruritus, skin lesions Neurological: as above Psychiatric: No depression, insomnia, anxiety Endocrine: No palpitations, fatigue, diaphoresis, mood swings, change in appetite, change in weight, increased thirst Hematologic/Lymphatic:  No anemia, purpura, petechiae. Allergic/Immunologic: no itchy/runny eyes, nasal congestion, recent allergic reactions, rashes  PHYSICAL EXAM: Filed Vitals:   02/20/13 0913  BP: 104/68  Pulse: 66  Temp: 98.1 F (36.7 C)   General: No acute distress Head:  Normocephalic/atraumatic Neck: supple, no paraspinal tenderness, full range of motion Back: No paraspinal tenderness Heart: regular rate and rhythm Lungs: Clear to auscultation bilaterally. Vascular: No carotid bruits. Neurological Exam: Mental status: alert and oriented to person, place, and time, speech fluent and not dysarthric, language intact. Cranial nerves: CN I: not tested CN II: pupils equal, round and reactive to light, visual fields intact, fundi unremarkable. CN III, IV, VI:  full range of motion, no nystagmus, no ptosis.  No fatigable ptosis. CN V: facial sensation intact CN VII: upper and lower face symmetric CN VIII: hearing intact CN IX, X: gag intact, uvula midline CN XI: sternocleidomastoid and trapezius muscles intact CN XII: tongue midline Bulk & Tone: normal, no fasciculations. Motor: 5/5 throughout, including neck flexion/extension, proximal and distal muscles of extremities.  No fatigable weakness.  No resting/kinetic/intention tremor.  Fine postural tremor in right hand, but able to write and draw Archimedes swirl without  difficulty or tremor.  No bradykinesia or rigidity.   Sensation: temperature and vibration intact Deep Tendon Reflexes: 2+ throughout, toes down Finger to nose testing: very fine tremor noted in right hand when arms outstretched, no kinetic/intention tremor, bradykinesia or dysmetria noted. Heel to shin: With mild tremor at first but able to perform without dysmetria Gait: normal stance and stride, able to walk on toes, heels and in tandem. Romberg negative.  IMPRESSION & PLAN: Multiple symptomatolgy.  Objective neurological exam is normal, which I reassured is a good thing.  Tremor does not seem physiologic.  I have no neurological explanation for his symptoms.  He reports that he is trying to follow up with [facility] again.  It may be a good idea to get a second opinion, since he is in the process of trying to see neurology there.  45 minutes spent with patient, over 50% spent counseling and coordinating care.  Thank you for allowing me to take part in the care of this patient.  Shon Millet, DO  CC:  Sharlot Gowda, MD

## 2013-05-04 ENCOUNTER — Ambulatory Visit (INDEPENDENT_AMBULATORY_CARE_PROVIDER_SITE_OTHER): Payer: BC Managed Care – PPO | Admitting: Medical

## 2013-05-04 ENCOUNTER — Encounter: Payer: Self-pay | Admitting: Medical

## 2013-05-04 VITALS — BP 100/70 | HR 92 | Temp 98.3°F | Resp 18 | Wt 152.0 lb

## 2013-05-04 DIAGNOSIS — R531 Weakness: Secondary | ICD-10-CM

## 2013-05-04 DIAGNOSIS — R5381 Other malaise: Secondary | ICD-10-CM

## 2013-05-04 DIAGNOSIS — R0602 Shortness of breath: Secondary | ICD-10-CM

## 2013-05-04 DIAGNOSIS — Z113 Encounter for screening for infections with a predominantly sexual mode of transmission: Secondary | ICD-10-CM

## 2013-05-04 DIAGNOSIS — R209 Unspecified disturbances of skin sensation: Secondary | ICD-10-CM

## 2013-05-04 DIAGNOSIS — R002 Palpitations: Secondary | ICD-10-CM

## 2013-05-04 DIAGNOSIS — R202 Paresthesia of skin: Secondary | ICD-10-CM

## 2013-05-04 LAB — TSH: TSH: 0.443 u[IU]/mL (ref 0.350–4.500)

## 2013-05-04 LAB — T4, FREE: Free T4: 1.19 ng/dL (ref 0.80–1.80)

## 2013-05-04 LAB — VITAMIN B12: Vitamin B-12: 335 pg/mL (ref 211–911)

## 2013-05-04 NOTE — Progress Notes (Signed)
Subjective: Here for multiple concerns.  Of note, he recently saw Dr. Susann Givens for some of these symptoms, has also seen an eye doctor and neurologist for his symptoms recently. He reports ongoing palpitations, occasional chest pains.  This can be at rest, can be with activity, doesn't seem to matter.  At times he reports short of breath with exertion. He even stopped his routine exercise worried about the shortness of breath. However sometimes he reports feeling need to take a breath as if his body has forgotten to breathe. When he feels this way he will lie down to rest and this helps. He still reports generalized weakness, weakness in his hands, intermittent numbness in his extremities. He says he worries about his health issues. He still has issues with his bowels and stomach, has even seen GI recently for the same. GI told him he had either IBS versus Crohn's versus celiac, but nothing obvious on endoscopy. 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  He had an EKG and 24-hour Holter monitor for the palpitations that was reportedly normal. He does not smoke, has smoked marijuana prior, drinks 4-5 alcoholic drinks per week. Denies cocaine use or amphetamine use. He lives with his mother and stepfather currently. He has a girlfriend. 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  He also reports a pulsation under his scrotum, at times can't keep an erection. Wants STD screening today, he has had other multiple partners since his last STD screening. No other aggravating or relieving factors.   Review of Systems Constitutional: -fever, -chills, -sweats, -unexpected weight change ENT: -runny nose, -ear pain, -sore throat Cardiology:  +chest pain, +palpitations, -edema Respiratory: -cough, +shortness of breath,  -wheezing Gastroenterology: -abdominal pain, -nausea, -vomiting, +loose stools, -constipation  Hematology: Occasional bleeding gums and teeth brushing, no bruising problems Musculoskeletal: -arthralgias, -myalgias, -joint swelling, -back pain Ophthalmology: -vision changes Urology: -dysuria, -difficulty urinating, -hematuria, -urinary frequency, -urgency Neurology: -headache    Objective: Filed Vitals:   05/04/13 1148  BP: 100/70  Pulse: 92  Temp: 98.3 F (36.8 C)  Resp: 18    General appearance: alert, no distress, WD/WN, lean AA male Skin: several arm tattoos bilat, no worrisome bruising HEENT: normocephalic, sclerae anicteric, TMs pearly, nares patent, no discharge or erythema, pharynx normal Oral cavity: MMM, no lesions Neck: supple, no lymphadenopathy, no thyromegaly, no masses Heart: RRR, normal S1, S2, no murmurs Lungs: CTA bilaterally, no wheezes, rhonchi, or rales Abdomen: +bs, soft, non tender, non distended, no masses, no hepatomegaly, no splenomegaly Pulses: 2+ symmetric, upper and lower extremities, normal cap refill Neuro: Cn2-12 intact, normal strength, sensation, DTRs, nonfocal exam Extremities: No edema GU: Normal external male genitalia, circumcised, no obvious mass, no hernia no lymphadenopathy Psych: Pleasant, answers questions appropriately   Assessment: Encounter Diagnoses  Name Primary?  . Palpitations Yes  . Paresthesia   . SOB (shortness of breath)   . Weakness   . Screen for STD (sexually transmitted disease)       Plan: I reviewed his recent neurology notes, Holter monitor results, EKG, Dr. Jola Babinski notes from here, GI notes, and when considering all of his variety of symptoms, anxiety seemed like a plausible diagnosis.  I spoke to Dr. Susann Givens about these symptoms.  At this point I will check STD labs.  Pending labs we will get a Holter event monitor  given that the 24-hour monitor was negative and no symptoms during that time.  STD screening  today.  Follow-up pending labs

## 2013-05-05 LAB — HIV ANTIBODY (ROUTINE TESTING W REFLEX): HIV: NONREACTIVE

## 2013-05-05 LAB — RPR

## 2013-05-05 LAB — GC/CHLAMYDIA PROBE AMP: GC Probe RNA: NEGATIVE

## 2013-05-07 ENCOUNTER — Telehealth: Payer: Self-pay | Admitting: Family Medicine

## 2013-05-07 ENCOUNTER — Other Ambulatory Visit: Payer: Self-pay | Admitting: Medical

## 2013-05-07 MED ORDER — CITALOPRAM HYDROBROMIDE 20 MG PO TABS: ORAL_TABLET | ORAL | Status: DC

## 2013-05-07 NOTE — Telephone Encounter (Signed)
I changed the patients pharmacy per the patient's request. CLS

## 2013-05-08 ENCOUNTER — Encounter: Payer: Self-pay | Admitting: Family Medicine

## 2013-05-08 ENCOUNTER — Telehealth: Payer: Self-pay | Admitting: Family Medicine

## 2013-05-08 ENCOUNTER — Other Ambulatory Visit: Payer: Self-pay | Admitting: Family Medicine

## 2013-05-08 DIAGNOSIS — R002 Palpitations: Secondary | ICD-10-CM

## 2013-05-08 NOTE — Telephone Encounter (Signed)
done

## 2013-05-18 ENCOUNTER — Telehealth: Payer: Self-pay | Admitting: Family Medicine

## 2013-05-18 NOTE — Telephone Encounter (Signed)
Jasmine December with [facility] called and left message on voice mail that Rondal has appt Dec 26th at 10:00 for monitor.

## 2013-05-21 NOTE — Telephone Encounter (Signed)
Pt. Is aware.

## 2013-06-14 ENCOUNTER — Telehealth: Payer: Self-pay | Admitting: Medical

## 2013-06-15 NOTE — Telephone Encounter (Signed)
Patient was made aware of the place he had his heart monitor place. CLS

## 2013-07-04 ENCOUNTER — Ambulatory Visit (INDEPENDENT_AMBULATORY_CARE_PROVIDER_SITE_OTHER): Payer: BC Managed Care – PPO | Admitting: *Deleted

## 2013-07-04 DIAGNOSIS — R002 Palpitations: Secondary | ICD-10-CM

## 2013-07-04 NOTE — Progress Notes (Signed)
Pt in for an Event Monitor for palpitations, ordered by Tysinger, PA-C.  No order for length of monitor.  Call to Dr. Jola BabinskiLalonde's office to clarify how long pt is supposed to wear the monitor.  Informed they want a 24-hr Event Monitor.  Informed 24-hr Holter does not record events and per records, pt had a Holter monitor already.  Informed I would be transferred to Dr. Jola BabinskiLalonde's nurse.  Informed event monitor is for at least 7-days.  Wylene MenShaundra, MA for Dr. Susann GivensLalonde, advised to order 7-day Event Monitor.  Pt in for Cardiac Event Monitor.  Monitor hooked up and instructions given.  Pt to call [employer] for questions and supplies for monitor  Pt verbalized understanding and agreed w/ plan.

## 2013-07-10 ENCOUNTER — Encounter: Payer: Self-pay | Admitting: Medical

## 2013-07-10 ENCOUNTER — Ambulatory Visit (INDEPENDENT_AMBULATORY_CARE_PROVIDER_SITE_OTHER): Payer: BC Managed Care – PPO | Admitting: Medical

## 2013-07-10 VITALS — BP 112/80 | HR 80 | Temp 98.3°F | Resp 18 | Wt 154.0 lb

## 2013-07-10 DIAGNOSIS — S060X9A Concussion with loss of consciousness of unspecified duration, initial encounter: Secondary | ICD-10-CM

## 2013-07-10 DIAGNOSIS — S060XAA Concussion with loss of consciousness status unknown, initial encounter: Secondary | ICD-10-CM

## 2013-07-10 DIAGNOSIS — S20219A Contusion of unspecified front wall of thorax, initial encounter: Secondary | ICD-10-CM

## 2013-07-10 DIAGNOSIS — IMO0002 Reserved for concepts with insufficient information to code with codable children: Secondary | ICD-10-CM

## 2013-07-10 DIAGNOSIS — S1093XA Contusion of unspecified part of neck, initial encounter: Secondary | ICD-10-CM

## 2013-07-10 DIAGNOSIS — T07XXXA Unspecified multiple injuries, initial encounter: Secondary | ICD-10-CM

## 2013-07-10 DIAGNOSIS — T7491XA Unspecified adult maltreatment, confirmed, initial encounter: Secondary | ICD-10-CM

## 2013-07-10 DIAGNOSIS — S0083XA Contusion of other part of head, initial encounter: Secondary | ICD-10-CM

## 2013-07-10 DIAGNOSIS — S0003XA Contusion of scalp, initial encounter: Secondary | ICD-10-CM

## 2013-07-10 DIAGNOSIS — S0093XA Contusion of unspecified part of head, initial encounter: Secondary | ICD-10-CM

## 2013-07-10 MED ORDER — NAPROXEN 375 MG PO TABS: 375.0000 mg | ORAL_TABLET | Freq: Two times a day (BID) | ORAL | Status: DC

## 2013-07-10 NOTE — Patient Instructions (Signed)
Thank you for giving me the opportunity to serve you today.    Your diagnosis today includes:  Multiple abrasions including forehead, lip, bilateral shoulders, bilateral knees, hands  Contusion/bruise to the head, chest wall, back, lip, and hand  Mild concussion  Specific recommendations today include:  You may use ice pack to any of the areas that ache or hurt  Begin Naprosyn prescription twice daily for pain and inflammation, and you can alternate this with over-the-counter Tylenol.  Alternate Naprosyn and Tylenol by at least 4 hours  Regarding the concussion, use rest including mental rest for the next 3-4 days.  If after 4 days you have no headache, no confusion, no mental fogginess, then gradually return to your normal activities.  However, if he still having headache, confusion, mental fogginess or other headache symptoms after 4-5 days, then you need to continue complete rest including mental and physical rest until symptoms are resolved  Avoid any repeat injury to the head or chest for the next several months  Follow up: 1 week regarding headache/concussion   I have included other useful information below for your review.  Concussion, Adult A concussion, or closed-head injury, is a brain injury caused by a direct blow to the head or by a quick and sudden movement (jolt) of the head or neck. Concussions are usually not life-threatening. Even so, the effects of a concussion can be serious. If you have had a concussion before, you are more likely to experience concussion-like symptoms after a direct blow to the head.  CAUSES   Direct blow to the head, such as from running into another player during a soccer game, being hit in a fight, or hitting your head on a hard surface.  A jolt of the head or neck that causes the brain to move back and forth inside the skull, such as in a car crash. SIGNS AND SYMPTOMS  The signs of a concussion can be hard to notice. Early on, they may be  missed by you, family members, and health care providers. You may look fine but act or feel differently. Symptoms are usually temporary, but they may last for days, weeks, or even longer. Some symptoms may appear right away while others may not show up for hours or days. Every head injury is different. Symptoms include:   Mild to moderate headaches that will not go away.  A feeling of pressure inside your head.  Having more trouble than usual:   Learning or remembering things you have heard.  Answering questions.  Paying attention or concentrating.   Organizing daily tasks.   Making decisions and solving problems.   Slowness in thinking, acting or reacting, speaking, or reading.   Getting lost or being easily confused.   Feeling tired all the time or lacking energy (fatigued).   Feeling drowsy.   Sleep disturbances.   Sleeping more than usual.   Sleeping less than usual.   Trouble falling asleep.   Trouble sleeping (insomnia).   Loss of balance or feeling lightheaded or dizzy.   Nausea or vomiting.   Numbness or tingling.   Increased sensitivity to:   Sounds.   Lights.   Distractions.   Vision problems or eyes that tire easily.   Diminished sense of taste or smell.   Ringing in the ears.   Mood changes such as feeling sad or anxious.   Becoming easily irritated or angry for little or no reason.   Lack of motivation.  Seeing or hearing things  other people do not see or hear (hallucinations). DIAGNOSIS  Your health care provider can usually diagnose a concussion based on a description of your injury and symptoms. He or she will ask whether you passed out (lost consciousness) and whether you are having trouble remembering events that happened right before and during your injury.  Your evaluation might include:   A brain scan to look for signs of injury to the brain. Even if the test shows no injury, you may still have a  concussion.   Blood tests to be sure other problems are not present. TREATMENT   Concussions are usually treated in an emergency department, in urgent care, or at a clinic. You may need to stay in the hospital overnight for further treatment.   Tell your health care provider if you are taking any medicines, including prescription medicines, over-the-counter medicines, and natural remedies. Some medicines, such as blood thinners (anticoagulants) and aspirin, may increase the chance of complications. Also tell your health care provider whether you have had alcohol or are taking illegal drugs. This information may affect treatment.  Your health care provider will send you home with important instructions to follow.  How fast you will recover from a concussion depends on many factors. These factors include how severe your concussion is, what part of your brain was injured, your age, and how healthy you were before the concussion.  Most people with mild injuries recover fully. Recovery can take time. In general, recovery is slower in older persons. Also, persons who have had a concussion in the past or have other medical problems may find that it takes longer to recover from their current injury. HOME CARE INSTRUCTIONS  General Instructions  Carefully follow the directions your health care provider gave you.  Only take over-the-counter or prescription medicines for pain, discomfort, or fever as directed by your health care provider.  Take only those medicines that your health care provider has approved.  Do not drink alcohol until your health care provider says you are well enough to do so. Alcohol and certain other drugs may slow your recovery and can put you at risk of further injury.  If it is harder than usual to remember things, write them down.  If you are easily distracted, try to do one thing at a time. For example, do not try to watch TV while fixing dinner.  Talk with family  members or close friends when making important decisions.  Keep all follow-up appointments. Repeated evaluation of your symptoms is recommended for your recovery.  Watch your symptoms and tell others to do the same. Complications sometimes occur after a concussion. Older adults with a brain injury may have a higher risk of serious complications such as of a blood clot on the brain.  Tell your teachers, school nurse, school counselor, coach, athletic trainer, or work Production designer, theatre/television/film about your injury, symptoms, and restrictions. Tell them about what you can or cannot do. They should watch for:   Increased problems with attention or concentration.   Increased difficulty remembering or learning new information.   Increased time needed to complete tasks or assignments.   Increased irritability or decreased ability to cope with stress.   Increased symptoms.   Rest. Rest helps the brain to heal. Make sure you:  Get plenty of sleep at night. Avoid staying up late at night.  Keep the same bedtime hours on weekends and weekdays.  Rest during the day. Take daytime naps or rest breaks when you feel  tired.  Limit activities that require a lot of thought or concentration. These includes   Doing homework or job-related work.   Watching TV.   Working on the computer.  Avoid any situation where there is potential for another head injury (football, hockey, soccer, basketball, martial arts, downhill snow sports and horseback riding). Your condition will get worse every time you experience a concussion. You should avoid these activities until you are evaluated by the appropriate follow-up caregivers. Returning To Your Regular Activities You will need to return to your normal activities slowly, not all at once. You must give your body and brain enough time for recovery.  Do not return to sports or other athletic activities until your health care provider tells you it is safe to do so.  Ask your  health care provider when you can drive, ride a bicycle, or operate heavy machinery. Your ability to react may be slower after a brain injury. Never do these activities if you are dizzy.  Ask your health care provider about when you can return to work or school. Preventing Another Concussion It is very important to avoid another brain injury, especially before you have recovered. In rare cases, another injury can lead to permanent brain damage, brain swelling, or death. The risk of this is greatest during the first 7 10 days after a head injury. Avoid injuries by:   Wearing a seat belt when riding in a car.   Drinking alcohol only in moderation.   Wearing a helmet when biking, skiing, skateboarding, skating, or doing similar activities.  Avoiding activities that could lead to a second concussion, such as contact or recreational sports, until your health care provider says it is OK.  Taking safety measures in your home.   Remove clutter and tripping hazards from floors and stairways.   Use grab bars in bathrooms and handrails by stairs.   Place non-slip mats on floors and in bathtubs.   Improve lighting in dim areas. SEEK MEDICAL CARE IF:   You have increased problems paying attention or concentrating.   You have increased difficulty remembering or learning new information.   You need more time to complete tasks or assignments than before.   You have increased irritability or decreased ability to cope with stress.  You have more symptoms than before. Seek medical care if you have any of the following symptoms for more than 2 weeks after your injury:   Lasting (chronic) headaches.   Dizziness or balance problems.   Nausea.  Vision problems.   Increased sensitivity to noise or light.   Depression or mood swings.   Anxiety or irritability.   Memory problems.   Difficulty concentrating or paying attention.   Sleep problems.   Feeling tired all the  time. SEEK IMMEDIATE MEDICAL CARE IF:   You have severe or worsening headaches. These may be a sign of a blood clot in the brain.  You have weakness (even if only in one hand, leg, or part of the face).  You have numbness.  You have decreased coordination.   You vomit repeatedly.  You have increased sleepiness.  One pupil is larger than the other.   You have convulsions.   You have slurred speech.   You have increased confusion. This may be a sign of a blood clot in the brain.  You have increased restlessness, agitation, or irritability.   You are unable to recognize people or places.   You have neck pain.   It is  difficult to wake you up.   You have unusual behavior changes.   You lose consciousness. MAKE SURE YOU:   Understand these instructions.  Will watch your condition.  Will get help right away if you are not doing well or get worse. Document Released: 08/07/2003 Document Revised: 01/17/2013 Document Reviewed: 12/07/2012 [facility] Patient Information 2014 [place], [place].
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
## 2013-07-26 ENCOUNTER — Telehealth: Payer: Self-pay | Admitting: Internal Medicine

## 2013-07-26 NOTE — Telephone Encounter (Signed)
Faxed over medical records.

## 2013-08-08 ENCOUNTER — Ambulatory Visit (INDEPENDENT_AMBULATORY_CARE_PROVIDER_SITE_OTHER): Payer: BC Managed Care – PPO | Admitting: Medical

## 2013-08-08 ENCOUNTER — Encounter: Payer: Self-pay | Admitting: Medical

## 2013-08-08 VITALS — BP 120/80 | HR 62 | Temp 98.4°F | Resp 16 | Wt 152.0 lb

## 2013-08-08 DIAGNOSIS — R079 Chest pain, unspecified: Secondary | ICD-10-CM

## 2013-08-08 DIAGNOSIS — R002 Palpitations: Secondary | ICD-10-CM

## 2013-08-08 DIAGNOSIS — F411 Generalized anxiety disorder: Secondary | ICD-10-CM

## 2013-08-08 MED ORDER — CITALOPRAM HYDROBROMIDE 20 MG PO TABS: 20.0000 mg | ORAL_TABLET | Freq: Every day | ORAL | Status: DC

## 2013-08-08 NOTE — Progress Notes (Signed)
Subjective: I saw him in December for palpitations, chest pain, and he had also seen Dr. Susann GivensLalonde here at a month prior for similar.  Since his last visit he was sent to wear a Holter /event monitor for an extended several days.  He is here to review those results as well as recheck.  Lately still having chest pains, SOB, palpations, feels heart beat in back of neck.  Feels heart racing.  Sometimes hears voice shaky. Gets a lot of gas.  Saw GI, advised he didn't have celiac disease.  He just saw neurology last week and had updated [facility].  Stressors include being self employed, legal issues, long distance relationship, lives with father.  He still is trying to figure out what he wants to do as a career long term.  No other aggravating or relieving factors.  Past Medical History  Diagnosis Date  . ADHD (attention deficit hyperactivity disorder)   . Eczema   . Oppositional defiant disorder     Review of Systems Constitutional: -fever, -chills, -sweats, -unexpected weight change ENT: -runny nose, -ear pain, -sore throat Cardiology:  +chest pain, +palpitations, -edema Respiratory: -cough, +shortness of breath, -wheezing Gastroenterology: -abdominal pain, -nausea, -vomiting, +loose stools, -constipation  Hematology: Occasional bleeding gums and teeth brushing, no bruising problems Musculoskeletal: -arthralgias, -myalgias, -joint swelling, -back pain Ophthalmology: -vision changes Urology: -dysuria, -difficulty urinating, -hematuria, -urinary frequency, -urgency Neurology: -headache    Objective: Filed Vitals:   08/08/13 1124  BP: 120/80  Pulse: 62  Temp: 98.4 F (36.9 C)  Resp: 16    General appearance: alert, no distress, WD/WN, lean AA male Neck: supple, no lymphadenopathy, no thyromegaly, no masses Heart: RRR, normal S1, S2, no murmurs Lungs: CTA bilaterally, no wheezes, rhonchi, or rales Pulses: 2+ symmetric, upper and lower extremities, normal cap refill Neuro: Cn2-12  intact, normal strength, sensation, DTRs, nonfocal exam Extremities: No edema Psych: Pleasant, answers questions appropriately   Assessment: Encounter Diagnoses  Name Primary?  Marland Kitchen. Anxiety state, unspecified Yes  . Chest pain   . Palpitations       Plan: I reviewed his recent event monitor which shows one run of PSVT, but he recalls exercising during the time that reading took place.  His symptoms strongly seem related to anxiety. I will start him on citalopram. We discussed risk and benefits of medication, he is agreeable to trial of this medication. We discussed beta blocker as an alternative.  We discussed his stressors, having a Mentor, trying to work through the things he is dealing with, consider counseling.  Recheck in 3-4 weeks.

## 2013-09-04 ENCOUNTER — Ambulatory Visit: Payer: BC Managed Care – PPO | Admitting: Medical

## 2013-12-11 ENCOUNTER — Encounter: Payer: Self-pay | Admitting: Family Medicine

## 2013-12-11 ENCOUNTER — Ambulatory Visit (INDEPENDENT_AMBULATORY_CARE_PROVIDER_SITE_OTHER): Payer: BC Managed Care – PPO | Admitting: Family Medicine

## 2013-12-11 VITALS — BP 150/90 | HR 58 | Wt 150.0 lb

## 2013-12-11 DIAGNOSIS — R319 Hematuria, unspecified: Secondary | ICD-10-CM

## 2013-12-11 DIAGNOSIS — N410 Acute prostatitis: Secondary | ICD-10-CM

## 2013-12-11 LAB — POCT URINALYSIS DIPSTICK
Bilirubin, UA: NEGATIVE
Glucose, UA: NEGATIVE
KETONES UA: NEGATIVE
Nitrite, UA: NEGATIVE
PH UA: 8
SPEC GRAV UA: 1.01
Urobilinogen, UA: NEGATIVE

## 2013-12-11 MED ORDER — SULFAMETHOXAZOLE-TMP DS 800-160 MG PO TABS: 1.0000 | ORAL_TABLET | Freq: Two times a day (BID) | ORAL | Status: DC

## 2013-12-11 NOTE — Progress Notes (Signed)
   Subjective:    Patient ID: Hunter Estes, male    DOB: Apr 21, 1991, 23 y.o.   MRN: [id number]  HPI He complains of seeing blood in his urine this morning with lower abdominal pain and a generalized feeling of weakness and decreased stream. He also states he had difficulty passing stool. He then complained of multiple other unrelated symptoms. Review of record indicates he has had difficulty with multiple complaints in the past all of which have had a negative workup.   Review of Systems     Objective:   Physical Exam Alert and in no distress. Abdominal and genital exam normal. Prostate exam does show a boggy and slightly tender prostate. Urine microscopic did show red cells.      Assessment & Plan:  Hematuria - Plan: POCT Urinalysis Dipstick  Acute prostatitis - Plan: sulfamethoxazole-trimethoprim (BACTRIM DS) 800-160 MG per tablet   He is to return here in 2 weeks for recheck.

## 2013-12-13 ENCOUNTER — Encounter (HOSPITAL_COMMUNITY): Payer: Self-pay | Admitting: Emergency Medicine

## 2013-12-13 ENCOUNTER — Telehealth: Payer: Self-pay | Admitting: Internal Medicine

## 2013-12-13 ENCOUNTER — Emergency Department (HOSPITAL_COMMUNITY)
Admission: EM | Admit: 2013-12-13 | Discharge: 2013-12-13 | Disposition: A | Discharge status: Home / Self Care | Payer: BC Managed Care – PPO | Attending: Emergency Medicine | Admitting: Emergency Medicine

## 2013-12-13 DIAGNOSIS — R319 Hematuria, unspecified: Secondary | ICD-10-CM | POA: Insufficient documentation

## 2013-12-13 DIAGNOSIS — Z792 Long term (current) use of antibiotics: Secondary | ICD-10-CM | POA: Insufficient documentation

## 2013-12-13 DIAGNOSIS — R109 Unspecified abdominal pain: Secondary | ICD-10-CM | POA: Insufficient documentation

## 2013-12-13 DIAGNOSIS — Z79899 Other long term (current) drug therapy: Secondary | ICD-10-CM | POA: Insufficient documentation

## 2013-12-13 DIAGNOSIS — Z872 Personal history of diseases of the skin and subcutaneous tissue: Secondary | ICD-10-CM | POA: Insufficient documentation

## 2013-12-13 DIAGNOSIS — Z8659 Personal history of other mental and behavioral disorders: Secondary | ICD-10-CM | POA: Insufficient documentation

## 2013-12-13 DIAGNOSIS — K921 Melena: Secondary | ICD-10-CM | POA: Insufficient documentation

## 2013-12-13 DIAGNOSIS — R1030 Lower abdominal pain, unspecified: Secondary | ICD-10-CM

## 2013-12-13 LAB — COMPREHENSIVE METABOLIC PANEL
ALT: 21 U/L (ref 0–53)
AST: 34 U/L (ref 0–37)
Albumin: 4.8 g/dL (ref 3.5–5.2)
Alkaline Phosphatase: 59 U/L (ref 39–117)
Anion gap: 13 (ref 5–15)
BUN: 12 mg/dL (ref 6–23)
CO2: 27 mEq/L (ref 19–32)
Calcium: 10.1 mg/dL (ref 8.4–10.5)
Chloride: 100 mEq/L (ref 96–112)
Creatinine, Ser: 1.16 mg/dL (ref 0.50–1.35)
GFR calc Af Amer: >90 mL/min (ref >90)
GFR calc non Af Amer: 87 mL/min — ABNORMAL LOW (ref >90)
Glucose, Bld: 93 mg/dL (ref 70–99)
Potassium: 4.4 mEq/L (ref 3.7–5.3)
Sodium: 140 mEq/L (ref 137–147)
Total Bilirubin: 0.9 mg/dL (ref 0.3–1.2)
Total Protein: 8.3 g/dL (ref 6.0–8.3)

## 2013-12-13 LAB — CBC WITH DIFFERENTIAL/PLATELET
Basophils Absolute: 0 10*3/uL (ref 0.0–0.1)
Basophils Relative: 1 % (ref 0–1)
Eosinophils Absolute: 0.3 10*3/uL (ref 0.0–0.7)
Eosinophils Relative: 5 % (ref 0–5)
HCT: 47.5 % (ref 39.0–52.0)
Hemoglobin: 15.8 g/dL (ref 13.0–17.0)
Lymphocytes Relative: 25 % (ref 12–46)
Lymphs Abs: 1.6 10*3/uL (ref 0.7–4.0)
MCH: 28.4 pg (ref 26.0–34.0)
MCHC: 33.3 g/dL (ref 30.0–36.0)
MCV: 85.3 fL (ref 78.0–100.0)
Monocytes Absolute: 0.7 10*3/uL (ref 0.1–1.0)
Monocytes Relative: 11 % (ref 3–12)
Neutro Abs: 3.8 10*3/uL (ref 1.7–7.7)
Neutrophils Relative %: 58 % (ref 43–77)
Platelets: 163 10*3/uL (ref 150–400)
RBC: 5.57 MIL/uL (ref 4.22–5.81)
RDW: 12.6 % (ref 11.5–15.5)
WBC: 6.3 10*3/uL (ref 4.0–10.5)

## 2013-12-13 LAB — URINALYSIS, ROUTINE W REFLEX MICROSCOPIC
Glucose, UA: NEGATIVE mg/dL
Hgb urine dipstick: NEGATIVE
Ketones, ur: NEGATIVE mg/dL
Leukocytes, UA: NEGATIVE
Nitrite: NEGATIVE
Protein, ur: NEGATIVE mg/dL
Specific Gravity, Urine: 1.029 (ref 1.005–1.030)
Urobilinogen, UA: 1 mg/dL (ref 0.0–1.0)
pH: 6.5 (ref 5.0–8.0)

## 2013-12-13 NOTE — ED Notes (Signed)
Per patient, states he started having hematuria 3 dats ago-was treated and given scripts which he has not taken-has BM this am which consisted of blood and moucus
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
## 2013-12-13 NOTE — Telephone Encounter (Signed)
Pt states now he is having blood in his stool. He wants to know if this can be the cause from when he has blood in urine. Please advise pt

## 2013-12-13 NOTE — Discharge Instructions (Signed)
Bloody Stools °Bloody stools often mean that there is a problem in the digestive tract. Your caregiver may use the term "melena" to describe black, tarry, and bad smelling stools or "hematochezia" to describe red or maroon-colored stools. Blood seen in the stool can be caused by bleeding anywhere along the intestinal tract.  °A black stool usually means that blood is coming from the upper part of the gastrointestinal tract (esophagus, stomach, or small bowel). Passing maroon-colored stools or bright red blood usually means that blood is coming from lower down in the large bowel or the rectum. However, sometimes massive bleeding in the stomach or small intestine can cause bright red bloody stools.  °Consuming black licorice, lead, iron pills, medicines containing bismuth subsalicylate, or blueberries can also cause black stools. Your caregiver can test black stools to see if blood is present. °It is important that the cause of the bleeding be found. Treatment can then be started, and the problem can be corrected. Rectal bleeding may not be serious, but you should not assume everything is okay until you know the cause. It is very important to follow up with your caregiver or a specialist in gastrointestinal problems. °CAUSES  °Blood in the stools can come from various underlying causes. Often, the cause is not found during your first visit. Testing is often needed to discover the cause of bleeding in the gastrointestinal tract. Causes range from simple to serious or even life-threatening. Possible causes include: °· Hemorrhoids. These are veins that are full of blood (engorged) in the rectum. They cause pain, inflammation, and may bleed. °· Anal fissures. These are areas of painful tearing which may bleed. They are often caused by passing hard stool. °· Diverticulosis. These are pouches that form on the colon over time, with age, and may bleed significantly. °· Diverticulitis. This is inflammation in areas with  diverticulosis. It can cause pain, fever, and bloody stools, although bleeding is rare. °· Proctitis and colitis. These are inflamed areas of the rectum or colon. They may cause pain, fever, and bloody stools. °· Polyps and cancer. Colon cancer is a leading cause of preventable cancer death. It often starts out as precancerous polyps that can be removed during a colonoscopy, preventing progression into cancer. Sometimes, polyps and cancer may cause rectal bleeding. °· Gastritis and ulcers. Bleeding from the upper gastrointestinal tract (near the stomach) may travel through the intestines and produce black, sometimes tarry, often bad smelling stools. In certain cases, if the bleeding is fast enough, the stools may not be black, but red and the condition may be life-threatening. °SYMPTOMS  °You may have stools that are bright red and bloody, that are normal color with blood on them, or that are dark black and tarry. In some cases, you may only have blood in the toilet bowl. Any of these cases need medical care. You may also have: °· Pain at the anus or anywhere in the rectum. °· Lightheadedness or feeling faint. °· Extreme weakness. °· Nausea or vomiting. °· Fever. °DIAGNOSIS °Your caregiver may use the following methods to find the cause of your bleeding: °· Taking a medical history. Age is important. Older people tend to develop polyps and cancer more often. If there is anal pain and a hard, large stool associated with bleeding, a tear of the anus may be the cause. If blood drips into the toilet after a bowel movement, bleeding hemorrhoids may be the problem. The color and frequency of the bleeding are additional considerations. In most cases, the medical history provides clues, but seldom the final   answer. °· A visual and finger (digital) exam. Your caregiver will inspect the anal area, looking for tears and hemorrhoids. A finger exam can provide information when there is tenderness or a growth inside. In men, the  prostate is also examined. °· Endoscopy. Several types of small, long scopes (endoscopes) are used to view the colon. °¨ In the office, your caregiver may use a rigid, or more commonly, a flexible viewing sigmoidoscope. This exam is called flexible sigmoidoscopy. It is performed in 5 to 10 minutes. °¨ A more thorough exam is accomplished with a colonoscope. It allows your caregiver to view the entire 5 to 6 foot long colon. Medicine to help you relax (sedative) is usually given for this exam. Frequently, a bleeding lesion may be present beyond the reach of the sigmoidoscope. So, a colonoscopy may be the best exam to start with. Both exams are usually done on an outpatient basis. This means the patient does not stay overnight in the hospital or surgery center. °¨ An upper endoscopy may be needed to examine your stomach. Sedation is used and a flexible endoscope is put in your mouth, down to your stomach. °· A barium enema X-ray. This is an X-ray exam. It uses liquid barium inserted by enema into the rectum. This test alone may not identify an actual bleeding point. X-rays highlight abnormal shadows, such as those made by lumps (tumors), diverticuli, or colitis. °TREATMENT  °Treatment depends on the cause of your bleeding.  °· For bleeding from the stomach or colon, the caregiver doing your endoscopy or colonoscopy may be able to stop the bleeding as part of the procedure. °· Inflammation or infection of the colon can be treated with medicines. °· Many rectal problems can be treated with creams, suppositories, or warm baths. °· Surgery is sometimes needed. °· Blood transfusions are sometimes needed if you have lost a lot of blood. °· For any bleeding problem, let your caregiver know if you take aspirin or other blood thinners regularly. °HOME CARE INSTRUCTIONS  °· Take any medicines exactly as prescribed. °· Keep your stools soft by eating a diet high in fiber. Prunes (1 to 3 a day) work well for many people. °· Drink  enough water and fluids to keep your urine clear or pale yellow. °· Take sitz baths if advised. A sitz bath is when you sit in a bathtub with warm water for 10 to 15 minutes to soak, soothe, and cleanse the rectal area. °· If enemas or suppositories are advised, be sure you know how to use them. Tell your caregiver if you have problems with this. °· Monitor your bowel movements to look for signs of improvement or worsening. °SEEK MEDICAL CARE IF:  °· You do not improve in the time expected. °· Your condition worsens after initial improvement. °· You develop any new symptoms. °SEEK IMMEDIATE MEDICAL CARE IF:  °· You develop severe or prolonged rectal bleeding. °· You vomit blood. °· You feel weak or faint. °· You have a fever. °MAKE SURE YOU: °· Understand these instructions. °· Will watch your condition. °· Will get help right away if you are not doing well or get worse. °Document Released: 05/07/2002 Document Revised: 08/09/2011 Document Reviewed: 10/02/2010 °[employer]® Patient Information ©2015 [employer]. This information is not intended to replace advice given to you by your health care provider. Make sure you discuss any questions you have with your health care provider. ° °Abdominal Pain °Many things can cause abdominal pain. Usually, abdominal pain is   not caused by a disease and will improve without treatment. It can often be observed and treated at home. Your health care provider will do a physical exam and possibly order blood tests and X-rays to help determine the seriousness of your pain. However, in many cases, more time must pass before a clear cause of the pain can be found. Before that point, your health care provider may not know if you need more testing or further treatment. °HOME CARE INSTRUCTIONS  °Monitor your abdominal pain for any changes. The following actions may help to alleviate any discomfort you are experiencing: °· Only take over-the-counter or prescription medicines as directed by  your health care provider. °· Do not take laxatives unless directed to do so by your health care provider. °· Try a clear liquid diet (broth, tea, or water) as directed by your health care provider. Slowly move to a bland diet as tolerated. °SEEK MEDICAL CARE IF: °· You have unexplained abdominal pain. °· You have abdominal pain associated with nausea or diarrhea. °· You have pain when you urinate or have a bowel movement. °· You experience abdominal pain that wakes you in the night. °· You have abdominal pain that is worsened or improved by eating food. °· You have abdominal pain that is worsened with eating fatty foods. °· You have a fever. °SEEK IMMEDIATE MEDICAL CARE IF:  °· Your pain does not go away within 2 hours. °· You keep throwing up (vomiting). °· Your pain is felt only in portions of the abdomen, such as the right side or the left lower portion of the abdomen. °· You pass bloody or black tarry stools. °MAKE SURE YOU: °· Understand these instructions.   °· Will watch your condition.   °· Will get help right away if you are not doing well or get worse.   °Document Released: 02/24/2005 Document Revised: 05/22/2013 Document Reviewed: 01/24/2013 °[employer]® Patient Information ©2015 [employer]. This information is not intended to replace advice given to you by your health care provider. Make sure you discuss any questions you have with your health care provider. ° °

## 2013-12-13 NOTE — Telephone Encounter (Signed)
These are usually separate issues. I see he went to the ER but I do not see a record of it. Asked him why he went to the ER and what they tell him

## 2013-12-14 NOTE — Telephone Encounter (Signed)
Have him schedule an appointment with [facility] to look into this.

## 2013-12-17 NOTE — Telephone Encounter (Signed)
Pt has appointment with [facility] on 12/18/13

## 2013-12-18 ENCOUNTER — Ambulatory Visit: Payer: Self-pay | Admitting: Medical

## 2013-12-18 ENCOUNTER — Telehealth: Payer: Self-pay | Admitting: Family Medicine

## 2013-12-18 NOTE — Telephone Encounter (Signed)
ER letter sent 

## 2013-12-20 NOTE — ED Provider Notes (Signed)
CSN: [id number]     Arrival date & time 12/13/13  1050 History   First MD Initiated Contact with Patient 12/13/13 1115     Chief Complaint  Patient presents with  . Hematuria  . Abdominal Pain     (Consider location/radiation/quality/duration/timing/severity/associated sxs/prior Treatment) HPI  23 year old male with numerous complaints. His chief complaints is to be hematuria. Person assist 3 days ago. Pain was. Patient reports that he was evaluated by his PCP and started on antibiotics for a "prostate infection" but he has yet to start taking them. No fevers or chills. Additionally is complaining of a bloody bowel movement this morning. Small streaks of blood mixed with mucus. No fevers or chills. Patient has many other complaints. His history is disjointed and many complaints are seemingly unrelated to his chief complaint. Increased thirst, fatigue, palpitations, et Karie Sodacetera.  Past Medical History  Diagnosis Date  . ADHD (attention deficit hyperactivity disorder)   . Eczema   . Oppositional defiant disorder    Past Surgical History  Procedure Laterality Date  . Right hand surgery  2014   Family History  Problem Relation Age of Onset  . Cancer Paternal Grandmother   . Hyperlipidemia Paternal Grandfather    History  Substance Use Topics  . Smoking status: Never Smoker   . Smokeless tobacco: Never Used     Comment: smokes weed  . Alcohol Use: Yes     Comment: liquor on the weekends    Review of Systems  All systems reviewed and negative, other than as noted in HPI.   Allergies  Review of patient's allergies indicates no known allergies.  Home Medications   Prior to Admission medications   Medication Sig Start Date End Date Taking? Authorizing Provider  amoxicillin-clavulanate (AUGMENTIN) 500-125 MG per tablet Take 1 tablet by mouth 2 (two) times daily.   Yes Historical Provider, MD  citalopram (CELEXA) 20 MG tablet Take 20 mg by mouth daily.   Yes Historical Provider,  MD  sulfamethoxazole-trimethoprim (BACTRIM DS) 800-160 MG per tablet Take 1 tablet by mouth 2 (two) times daily. 12/11/13  Yes Ronnald NianJohn C Lalonde, MD   BP 124/75  Pulse 73  Temp(Src) 98.4 F (36.9 C) (Oral)  Resp 16  SpO2 100% Physical Exam  Nursing note and vitals reviewed. Constitutional: He appears well-developed and well-nourished. No distress.  HENT:  Head: Normocephalic and atraumatic.  Eyes: Conjunctivae are normal. Right eye exhibits no discharge. Left eye exhibits no discharge.  Neck: Neck supple.  Cardiovascular: Normal rate, regular rhythm and normal heart sounds.  Exam reveals no gallop and no friction rub.   No murmur heard. Pulmonary/Chest: Effort normal and breath sounds normal. No respiratory distress.  Abdominal: Soft. He exhibits no distension. There is no tenderness.  Musculoskeletal: He exhibits no edema and no tenderness.  Neurological: He is alert.  Skin: Skin is warm and dry.  Psychiatric: He has a normal mood and affect. His behavior is normal. Thought content normal.    ED Course  Procedures (including critical care time) Labs Review Labs Reviewed  COMPREHENSIVE METABOLIC PANEL - Abnormal; Notable for the following:    GFR calc non Af Amer 87 (*)    All other components within normal limits  URINALYSIS, ROUTINE W REFLEX MICROSCOPIC - Abnormal; Notable for the following:    Color, Urine AMBER (*)    Bilirubin Urine SMALL (*)    All other components within normal limits  CBC WITH DIFFERENTIAL    Imaging Review No results found.  EKG Interpretation None       MDM   Final diagnoses:  Lower abdominal pain  Bloody stool    23 year old male with numerous complaints. Review of systems his pain positive. His urinalysis today does not show any evidence of bleeding. His abdominal exam is benign. His H&H is normal. He is not on any blood thinners. Patient reports a history of irritable bowel syndrome. This may potentially be BP allergies current  complaints. Regardless, I have a low suspicion for a more sinister process. I feel he does stable for discharge at this time. Return precautions were discussed.  Raeford Razor, MD 12/20/13 1044

## 2013-12-25 ENCOUNTER — Ambulatory Visit: Payer: BC Managed Care – PPO | Admitting: Family Medicine

## 2014-01-02 ENCOUNTER — Encounter: Payer: Self-pay | Admitting: Medical

## 2014-05-14 ENCOUNTER — Ambulatory Visit: Payer: BC Managed Care – PPO | Admitting: Medical

## 2014-05-14 ENCOUNTER — Telehealth: Payer: Self-pay | Admitting: Internal Medicine

## 2014-05-14 NOTE — Telephone Encounter (Signed)
[facility] to [facility] @ [phone]

## 2014-05-29 ENCOUNTER — Ambulatory Visit: Payer: BC Managed Care – PPO | Admitting: Cardiovascular Disease

## 2014-06-03 ENCOUNTER — Encounter: Payer: Self-pay | Admitting: Family Medicine

## 2014-06-28 ENCOUNTER — Telehealth: Payer: Self-pay | Admitting: Internal Medicine

## 2014-06-28 NOTE — Telephone Encounter (Signed)
[facility] is requesting records for most recent office notes and lab results. Due to the sensitivity of labs, pt did give me a verbal ok to send his lab results over to them

## 2014-07-10 ENCOUNTER — Ambulatory Visit: Payer: Self-pay | Admitting: Cardiovascular Disease

## 2014-07-31 DIAGNOSIS — K580 Irritable bowel syndrome with diarrhea: Secondary | ICD-10-CM

## 2014-07-31 DIAGNOSIS — R140 Abdominal distension (gaseous): Secondary | ICD-10-CM

## 2014-07-31 HISTORY — DX: Abdominal distension (gaseous): R14.0

## 2014-07-31 HISTORY — DX: Irritable bowel syndrome with diarrhea: K58.0

## 2014-08-13 ENCOUNTER — Encounter: Payer: Self-pay | Admitting: Internal Medicine

## 2014-11-21 ENCOUNTER — Ambulatory Visit (INDEPENDENT_AMBULATORY_CARE_PROVIDER_SITE_OTHER): Payer: BLUE CROSS/BLUE SHIELD | Admitting: Family Medicine

## 2014-11-21 ENCOUNTER — Encounter: Payer: Self-pay | Admitting: Family Medicine

## 2014-11-21 VITALS — BP 120/80 | HR 65 | Wt 160.0 lb

## 2014-11-21 DIAGNOSIS — Z202 Contact with and (suspected) exposure to infections with a predominantly sexual mode of transmission: Secondary | ICD-10-CM

## 2014-11-21 DIAGNOSIS — Z209 Contact with and (suspected) exposure to unspecified communicable disease: Secondary | ICD-10-CM | POA: Diagnosis not present

## 2014-11-21 LAB — RPR

## 2014-11-21 MED ORDER — METRONIDAZOLE 500 MG PO TABS: ORAL_TABLET | ORAL | Status: DC
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
## 2014-11-22 LAB — HIV ANTIBODY (ROUTINE TESTING W REFLEX): HIV 1&2 Ab, 4th Generation: NONREACTIVE

## 2014-11-22 LAB — GC/CHLAMYDIA PROBE AMP, URINE
CHLAMYDIA, SWAB/URINE, PCR: NEGATIVE
GC PROBE AMP, URINE: NEGATIVE

## 2014-12-12 ENCOUNTER — Ambulatory Visit (INDEPENDENT_AMBULATORY_CARE_PROVIDER_SITE_OTHER): Payer: BLUE CROSS/BLUE SHIELD | Admitting: Family Medicine

## 2014-12-12 ENCOUNTER — Encounter: Payer: Self-pay | Admitting: Family Medicine

## 2014-12-12 VITALS — BP 120/80 | HR 77 | Ht 69.0 in | Wt 157.0 lb

## 2014-12-12 DIAGNOSIS — R251 Tremor, unspecified: Secondary | ICD-10-CM

## 2014-12-12 DIAGNOSIS — F329 Major depressive disorder, single episode, unspecified: Secondary | ICD-10-CM | POA: Diagnosis not present

## 2014-12-12 DIAGNOSIS — R1084 Generalized abdominal pain: Secondary | ICD-10-CM

## 2014-12-12 DIAGNOSIS — Z0000 Encounter for general adult medical examination without abnormal findings: Secondary | ICD-10-CM

## 2014-12-12 DIAGNOSIS — F32A Depression, unspecified: Secondary | ICD-10-CM

## 2014-12-12 DIAGNOSIS — R413 Other amnesia: Secondary | ICD-10-CM | POA: Diagnosis not present

## 2014-12-12 DIAGNOSIS — R531 Weakness: Secondary | ICD-10-CM | POA: Diagnosis not present

## 2014-12-12 DIAGNOSIS — Z87898 Personal history of other specified conditions: Secondary | ICD-10-CM

## 2014-12-12 DIAGNOSIS — Z8659 Personal history of other mental and behavioral disorders: Secondary | ICD-10-CM

## 2014-12-12 LAB — CBC WITH DIFFERENTIAL/PLATELET
BASOS PCT: 0 % (ref 0–1)
Basophils Absolute: 0 10*3/uL (ref 0.0–0.1)
EOS ABS: 0.5 10*3/uL (ref 0.0–0.7)
Eosinophils Relative: 6 % — ABNORMAL HIGH (ref 0–5)
HCT: 46.2 % (ref 39.0–52.0)
Hemoglobin: 15.7 g/dL (ref 13.0–17.0)
LYMPHS ABS: 2.5 10*3/uL (ref 0.7–4.0)
Lymphocytes Relative: 29 % (ref 12–46)
MCH: 28.8 pg (ref 26.0–34.0)
MCHC: 34 g/dL (ref 30.0–36.0)
MCV: 84.8 fL (ref 78.0–100.0)
MPV: 10.4 fL (ref 8.6–12.4)
Monocytes Absolute: 0.3 10*3/uL (ref 0.1–1.0)
Monocytes Relative: 4 % (ref 3–12)
Neutro Abs: 5.2 10*3/uL (ref 1.7–7.7)
Neutrophils Relative %: 61 % (ref 43–77)
PLATELETS: 186 10*3/uL (ref 150–400)
RBC: 5.45 MIL/uL (ref 4.22–5.81)
RDW: 13.7 % (ref 11.5–15.5)
WBC: 8.5 10*3/uL (ref 4.0–10.5)

## 2014-12-12 LAB — TSH: TSH: 0.748 u[IU]/mL (ref 0.350–4.500)

## 2014-12-12 NOTE — Progress Notes (Signed)
Subjective:    Patient ID: Hunter Estes, male    DOB: June 22, 1990, 24 y.o.   MRN: [id number]  HPI He is here for complete examination. He continues to complain of a plethora of issues. He complains of weakness, tremor, neck pain, abdominal pain that is intermittent in nature. He also complains that he is starting and has had memory loss. He states this has occurred to the point where he has on occasion had used GPS to get back home. On his way over here he went past our office had to turn around. He also complains of bleeding from the gums. He has had an extensive workup in the past. The medical records were reviewed. He has seen multiple specialists including rheumatology, neurology, gastroenterology. There is a questionable history of celiac disease with positive blood work but no biopsy. The rest of his family and social history as well as health maintenance was reviewed.   Review of Systems  All other systems reviewed and are negative.      Objective:   Physical Exam BP 120/80 mmHg  Pulse 77  Ht  (1.753 m)  Wt 157 lb (71.215 kg)  BMI 23.17 kg/m2  SpO2 98%  General Appearance:    Alert, cooperative, no distress, appears stated age  Head:    Normocephalic, without obvious abnormality, atraumatic  Eyes:    PERRL, conjunctiva/corneas clear, EOM's intact, fundi    benign  Ears:    Normal TM's and external ear canals  Nose:   Nares normal, mucosa normal, no drainage or sinus   tenderness  Throat:   Lips, mucosa, and tongue normal; teeth and gums normal  Neck:   Supple, no lymphadenopathy;  thyroid:  no   enlargement/tenderness/nodules; no carotid   bruit or JVD  Back:    Spine nontender, no curvature, ROM normal, no CVA     tenderness  Lungs:     Clear to auscultation bilaterally without wheezes, rales or     ronchi; respirations unlabored  Chest Wall:    No tenderness or deformity   Heart:    Regular rate and rhythm, S1 and S2 normal, no murmur, rub   or gallop  Breast Exam:     No chest wall tenderness, masses or gynecomastia  Abdomen:     Soft, non-tender, nondistended, normoactive bowel sounds,    no masses, no hepatosplenomegaly  Genitalia:    Normal male external genitalia without lesions.  Testicles without masses.  No inguinal hernias.  Rectal:   Deferred due to age <40 and lack of symptoms  Extremities:   No clubbing, cyanosis or edema  Pulses:   2+ and symmetric all extremities  Skin:   Skin color, texture, turgor normal, no rashes or lesions.Multiple Tubes present  Lymph nodes:   Cervical, supraclavicular, and axillary nodes normal  Neurologic:   CNII-XII intact, normal strength, sensation and gait; reflexes 1+ and symmetric throughout          Psych:   Normal mood, affect, hygiene and grooming. PH Q9 19.         Assessment & Plan:  Routine general medical examination at a health care facility - Plan: CBC with Differential/Platelet, Comprehensive metabolic panel, Lipid panel, TSH  Weakness - Plan: TSH  Generalized abdominal pain - Plan: TSH  Tremor - Plan: TSH  History of memory loss - Plan: TSH  Memory loss - Plan: TSH  Depression - Plan: TSH I will again get blood work on him.Discuss possible celiac disease  with GI. Also discussed with him the possibility that this is also logical in nature. Might possibly place him on Effexor.

## 2014-12-13 LAB — COMPREHENSIVE METABOLIC PANEL
ALT: 17 U/L (ref 0–53)
AST: 20 U/L (ref 0–37)
Albumin: 4.5 g/dL (ref 3.5–5.2)
Alkaline Phosphatase: 58 U/L (ref 39–117)
BUN: 13 mg/dL (ref 6–23)
CHLORIDE: 103 meq/L (ref 96–112)
CO2: 28 mEq/L (ref 19–32)
CREATININE: 1.15 mg/dL (ref 0.50–1.35)
Calcium: 9.7 mg/dL (ref 8.4–10.5)
Glucose, Bld: 83 mg/dL (ref 70–99)
Potassium: 4.5 mEq/L (ref 3.5–5.3)
Sodium: 143 mEq/L (ref 135–145)
Total Bilirubin: 0.6 mg/dL (ref 0.2–1.2)
Total Protein: 7.8 g/dL (ref 6.0–8.3)

## 2014-12-13 LAB — LIPID PANEL
CHOL/HDL RATIO: 2.3 ratio
Cholesterol: 141 mg/dL (ref 0–200)
HDL: 61 mg/dL (ref >=40)
LDL Cholesterol: 72 mg/dL (ref 0–99)
Triglycerides: 42 mg/dL (ref <150)
VLDL: 8 mg/dL (ref 0–40)

## 2014-12-17 ENCOUNTER — Ambulatory Visit (INDEPENDENT_AMBULATORY_CARE_PROVIDER_SITE_OTHER): Payer: BLUE CROSS/BLUE SHIELD | Admitting: Family Medicine

## 2014-12-17 DIAGNOSIS — R1084 Generalized abdominal pain: Secondary | ICD-10-CM | POA: Diagnosis not present

## 2014-12-17 DIAGNOSIS — R531 Weakness: Secondary | ICD-10-CM

## 2014-12-17 DIAGNOSIS — R251 Tremor, unspecified: Secondary | ICD-10-CM

## 2014-12-17 NOTE — Patient Instructions (Signed)
Dr. Emerson MonteParrish McKinney [phone]

## 2014-12-17 NOTE — Progress Notes (Signed)
   Subjective:    Patient ID: Hunter Estes, male    DOB: 17-Sep-1990, 24 y.o.   MRN: [id number]  HPI He is here for consult. His recent blood work was entirely negative. I recently discussed this case with Hunter Estes to ensure that there is no evidence of celiac disease. The biopsy that he did do was negative indicating no evidence of iliac disease. I reviewed all the recent information from him and discussed the fact that this seems to be mainly psychological in origin. He seemed to handle this quite well. Explained the fact that since he has had multiple symptoms over in extended period of time, if it was anything significant it should've shown up by now.   Review of Systems     Objective:   Physical Exam Alert and in no distress with appropriate affect.       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 I recommended that he see psychiatry. He will set up an appointment with Hunter Estes. I will then send her all the information.

## 2014-12-19 ENCOUNTER — Emergency Department (HOSPITAL_COMMUNITY): Payer: Self-pay

## 2014-12-19 ENCOUNTER — Emergency Department (HOSPITAL_COMMUNITY)
Admission: EM | Admit: 2014-12-19 | Discharge: 2014-12-19 | Disposition: A | Discharge status: Home / Self Care | Payer: Self-pay | Attending: Emergency Medicine | Admitting: Emergency Medicine

## 2014-12-19 ENCOUNTER — Encounter (HOSPITAL_COMMUNITY): Payer: Self-pay | Admitting: Emergency Medicine

## 2014-12-19 ENCOUNTER — Telehealth: Payer: Self-pay | Admitting: *Deleted

## 2014-12-19 DIAGNOSIS — W130XXA Fall from, out of or through balcony, initial encounter: Secondary | ICD-10-CM | POA: Insufficient documentation

## 2014-12-19 DIAGNOSIS — Y998 Other external cause status: Secondary | ICD-10-CM | POA: Insufficient documentation

## 2014-12-19 DIAGNOSIS — Y92009 Unspecified place in unspecified non-institutional (private) residence as the place of occurrence of the external cause: Secondary | ICD-10-CM | POA: Insufficient documentation

## 2014-12-19 DIAGNOSIS — T148XXA Other injury of unspecified body region, initial encounter: Secondary | ICD-10-CM

## 2014-12-19 DIAGNOSIS — W19XXXA Unspecified fall, initial encounter: Secondary | ICD-10-CM

## 2014-12-19 DIAGNOSIS — Z872 Personal history of diseases of the skin and subcutaneous tissue: Secondary | ICD-10-CM | POA: Insufficient documentation

## 2014-12-19 DIAGNOSIS — Y9389 Activity, other specified: Secondary | ICD-10-CM | POA: Insufficient documentation

## 2014-12-19 DIAGNOSIS — F10920 Alcohol use, unspecified with intoxication, uncomplicated: Secondary | ICD-10-CM

## 2014-12-19 DIAGNOSIS — F10120 Alcohol abuse with intoxication, uncomplicated: Secondary | ICD-10-CM | POA: Insufficient documentation

## 2014-12-19 DIAGNOSIS — Z8719 Personal history of other diseases of the digestive system: Secondary | ICD-10-CM | POA: Insufficient documentation

## 2014-12-19 DIAGNOSIS — S0083XA Contusion of other part of head, initial encounter: Secondary | ICD-10-CM | POA: Insufficient documentation

## 2014-12-19 DIAGNOSIS — Z79899 Other long term (current) drug therapy: Secondary | ICD-10-CM | POA: Insufficient documentation

## 2014-12-19 LAB — COMPREHENSIVE METABOLIC PANEL
ALT: 21 U/L (ref 17–63)
AST: 29 U/L (ref 15–41)
Albumin: 4.4 g/dL (ref 3.5–5.0)
Alkaline Phosphatase: 54 U/L (ref 38–126)
Anion gap: 10 (ref 5–15)
BUN: 8 mg/dL (ref 6–20)
CHLORIDE: 106 mmol/L (ref 101–111)
CO2: 25 mmol/L (ref 22–32)
CREATININE: 1.26 mg/dL — AB (ref 0.61–1.24)
Calcium: 9.2 mg/dL (ref 8.9–10.3)
GFR calc Af Amer: >60 mL/min (ref >60)
GFR calc non Af Amer: >60 mL/min (ref >60)
Glucose, Bld: 104 mg/dL — ABNORMAL HIGH (ref 65–99)
POTASSIUM: 3.8 mmol/L (ref 3.5–5.1)
SODIUM: 141 mmol/L (ref 135–145)
TOTAL PROTEIN: 7.9 g/dL (ref 6.5–8.1)
Total Bilirubin: 0.6 mg/dL (ref 0.3–1.2)

## 2014-12-19 LAB — CBC WITH DIFFERENTIAL/PLATELET
BASOS ABS: 0 10*3/uL (ref 0.0–0.1)
BASOS PCT: 0 % (ref 0–1)
Eosinophils Absolute: 0.3 10*3/uL (ref 0.0–0.7)
Eosinophils Relative: 4 % (ref 0–5)
HEMATOCRIT: 45.7 % (ref 39.0–52.0)
HEMOGLOBIN: 15.6 g/dL (ref 13.0–17.0)
LYMPHS PCT: 26 % (ref 12–46)
Lymphs Abs: 1.9 10*3/uL (ref 0.7–4.0)
MCH: 29.1 pg (ref 26.0–34.0)
MCHC: 34.1 g/dL (ref 30.0–36.0)
MCV: 85.3 fL (ref 78.0–100.0)
MONO ABS: 0.4 10*3/uL (ref 0.1–1.0)
Monocytes Relative: 6 % (ref 3–12)
NEUTROS ABS: 4.6 10*3/uL (ref 1.7–7.7)
Neutrophils Relative %: 64 % (ref 43–77)
Platelets: 159 10*3/uL (ref 150–400)
RBC: 5.36 MIL/uL (ref 4.22–5.81)
RDW: 12.9 % (ref 11.5–15.5)
WBC: 7.2 10*3/uL (ref 4.0–10.5)

## 2014-12-19 LAB — ETHANOL: Alcohol, Ethyl (B): 230 mg/dL — ABNORMAL HIGH (ref <5)

## 2014-12-19 LAB — TYPE AND SCREEN
ABO/RH(D): O POS
Antibody Screen: NEGATIVE

## 2014-12-19 LAB — ABO/RH: ABO/RH(D): O POS

## 2014-12-19 MED ORDER — AMMONIA AROMATIC IN INHA
RESPIRATORY_TRACT | Status: AC
  Filled 2014-12-19: qty 10

## 2014-12-19 MED ORDER — IBUPROFEN 800 MG PO TABS: 800.0000 mg | ORAL_TABLET | Freq: Three times a day (TID) | ORAL | Status: DC

## 2014-12-19 MED ORDER — IBUPROFEN 800 MG PO TABS
800.0000 mg | ORAL_TABLET | Freq: Once | ORAL | Status: AC
  Administered 2014-12-19: 800 mg via ORAL
  Filled 2014-12-19: qty 1

## 2014-12-19 MED ORDER — MIDAZOLAM HCL 5 MG/5ML IJ SOLN
INTRAMUSCULAR | Status: AC | PRN
  Administered 2014-12-19: 5 mg via INTRAVENOUS

## 2014-12-19 MED ORDER — SODIUM CHLORIDE 0.9 % IV BOLUS (SEPSIS)
1000.0000 mL | Freq: Once | INTRAVENOUS | Status: AC
  Administered 2014-12-19: 1000 mL via INTRAVENOUS

## 2014-12-19 MED ORDER — OXYCODONE-ACETAMINOPHEN 5-325 MG PO TABS: 1.0000 | ORAL_TABLET | Freq: Four times a day (QID) | ORAL | Status: DC | PRN

## 2014-12-19 MED ORDER — MIDAZOLAM HCL 2 MG/2ML IJ SOLN
INTRAMUSCULAR | Status: AC
  Filled 2014-12-19: qty 6

## 2014-12-19 MED ORDER — MIDAZOLAM HCL 2 MG/2ML IJ SOLN: 5.0000 mg | Freq: Once | INTRAMUSCULAR | Status: AC

## 2014-12-19 NOTE — ED Notes (Signed)
MD at bedside. 

## 2014-12-19 NOTE — ED Notes (Signed)
Pt ambulated w/ some difficulty d/t pain in right ankle. Pt needs minimal-to-one assist while ambulating.
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
## 2014-12-19 NOTE — ED Notes (Signed)
Pt c/o right ankle pain - tender to palpation, swelling noted. EDP aware - xray of right ankle ordered.

## 2014-12-19 NOTE — ED Notes (Signed)
Pt agitated in bed, moving back and forth. Family asking for barf bag. Pt not verbally responding. Pt sat up right in bed for prevention of aspiration.

## 2014-12-19 NOTE — ED Provider Notes (Signed)
Patient is alert and oriented. No neurological deficits. Explained test results to patient and his mother. Copies of test given to family. Patient appears sober. Will discharge  Donnetta Hutching, MD 12/19/14 1157
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
## 2014-12-19 NOTE — ED Notes (Signed)
CT called stating pt was "freaking out" when transferred to scanner. RN went to scanner, pt twitching/shaking, eyes open, tongue out stating "I made a promise!" Refusing to hold still. Responsive to sternal rubs. See MAR.

## 2014-12-19 NOTE — ED Notes (Signed)
Pt now complaining of left arm pain - EDP aware. No new orders at this time.
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
## 2014-12-19 NOTE — Telephone Encounter (Signed)
Pt mom called stating pt was advised to take ibuprofen for pain and it was not helping; requested stronger pain med.  NCM obtained Rx for Percocet 1-2 q 4-6 hours per pain #20 from Dr Adriana Simas.  NCM will leave with evening NCM Burna Mortimer) as pt mom plans to pick up this evening.
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
## 2014-12-20 ENCOUNTER — Encounter: Payer: Self-pay | Admitting: Family Medicine

## 2014-12-20 ENCOUNTER — Ambulatory Visit (INDEPENDENT_AMBULATORY_CARE_PROVIDER_SITE_OTHER): Payer: BLUE CROSS/BLUE SHIELD | Admitting: Family Medicine

## 2014-12-20 VITALS — BP 130/100 | HR 100 | Wt 158.0 lb

## 2014-12-20 DIAGNOSIS — T148 Other injury of unspecified body region: Secondary | ICD-10-CM

## 2014-12-20 DIAGNOSIS — T07XXXA Unspecified multiple injuries, initial encounter: Secondary | ICD-10-CM

## 2014-12-20 NOTE — Patient Instructions (Signed)
Listen to your body. If it hurts don't do it.. Take 4 Advil 3 times per day. Use the codeine if the Advil isn't controlling it
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
## 2014-12-25 ENCOUNTER — Inpatient Hospital Stay: Payer: BLUE CROSS/BLUE SHIELD | Admitting: Family Medicine

## 2014-12-26 ENCOUNTER — Telehealth: Payer: Self-pay | Admitting: Family Medicine

## 2014-12-26 NOTE — Telephone Encounter (Signed)
Pt is still in a lot of pain from his fall. Requesting a referral to a Orthopedic or some specialist that may be able to help him
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
## 2014-12-30 NOTE — Telephone Encounter (Signed)
Pt is coming in tomorrow morning to follow-up

## 2014-12-31 ENCOUNTER — Encounter: Payer: Self-pay | Admitting: Family Medicine

## 2014-12-31 ENCOUNTER — Ambulatory Visit (INDEPENDENT_AMBULATORY_CARE_PROVIDER_SITE_OTHER): Payer: BLUE CROSS/BLUE SHIELD | Admitting: Family Medicine

## 2014-12-31 VITALS — BP 130/82 | HR 74

## 2014-12-31 DIAGNOSIS — M542 Cervicalgia: Secondary | ICD-10-CM

## 2014-12-31 DIAGNOSIS — M79671 Pain in right foot: Secondary | ICD-10-CM

## 2014-12-31 DIAGNOSIS — M79672 Pain in left foot: Secondary | ICD-10-CM

## 2014-12-31 MED ORDER — DICLOFENAC SODIUM 75 MG PO TBEC: 75.0000 mg | DELAYED_RELEASE_TABLET | Freq: Two times a day (BID) | ORAL | Status: DC

## 2014-12-31 NOTE — Progress Notes (Signed)
   Subjective:    Patient ID: Hunter Estes, male    DOB: 1990-09-22, 24 y.o.   MRN: [id number]  HPI He is here for a recheck. He states that he is still having a fair amount of neck pain that is unresponsive to the ibuprofen. He states his pain is not improved at all. He also complains of bilateral heel pain. He did not mention this with his last visit.   Review of Systems     Objective:   Physical Exam Pain on motion of the neck. Tender to palpation over the lower posterior spinous processes. Normal motor, sensory and DTRs of his arm. Exam of both feet does show some slight calcaneal tenderness on the medial and lateral side. No pain over the calcaneal spur. Achilles tendon is normal. Good motion of the ankles.       Assessment & Plan:  Neck pain - Plan: Ambulatory referral to Physical Therapy, diclofenac (VOLTAREN) 75 MG EC tablet  Heel pain, bilateral  he is to continue with heat and stretching. I will also refer him to physical therapy. He is to return here in one month. Recommended trying heel cups or arch supports to help with his foot discomfort.

## 2014-12-31 NOTE — Patient Instructions (Signed)
Try heel cups first and if that doesn't work then get arch support

## 2015-04-08 ENCOUNTER — Encounter (HOSPITAL_COMMUNITY): Payer: Self-pay | Admitting: Emergency Medicine

## 2015-04-08 ENCOUNTER — Emergency Department (HOSPITAL_COMMUNITY)
Admission: EM | Admit: 2015-04-08 | Discharge: 2015-04-08 | Disposition: A | Discharge status: Home / Self Care | Payer: No Typology Code available for payment source | Attending: Emergency Medicine | Admitting: Emergency Medicine

## 2015-04-08 ENCOUNTER — Emergency Department (HOSPITAL_COMMUNITY): Payer: No Typology Code available for payment source

## 2015-04-08 DIAGNOSIS — Z8719 Personal history of other diseases of the digestive system: Secondary | ICD-10-CM | POA: Insufficient documentation

## 2015-04-08 DIAGNOSIS — R42 Dizziness and giddiness: Secondary | ICD-10-CM | POA: Diagnosis not present

## 2015-04-08 DIAGNOSIS — S4991XA Unspecified injury of right shoulder and upper arm, initial encounter: Secondary | ICD-10-CM | POA: Diagnosis not present

## 2015-04-08 DIAGNOSIS — S3991XA Unspecified injury of abdomen, initial encounter: Secondary | ICD-10-CM | POA: Diagnosis not present

## 2015-04-08 DIAGNOSIS — Y9389 Activity, other specified: Secondary | ICD-10-CM | POA: Diagnosis not present

## 2015-04-08 DIAGNOSIS — Z8659 Personal history of other mental and behavioral disorders: Secondary | ICD-10-CM | POA: Diagnosis not present

## 2015-04-08 DIAGNOSIS — S0990XA Unspecified injury of head, initial encounter: Secondary | ICD-10-CM | POA: Insufficient documentation

## 2015-04-08 DIAGNOSIS — Z791 Long term (current) use of non-steroidal anti-inflammatories (NSAID): Secondary | ICD-10-CM | POA: Diagnosis not present

## 2015-04-08 DIAGNOSIS — Z872 Personal history of diseases of the skin and subcutaneous tissue: Secondary | ICD-10-CM | POA: Diagnosis not present

## 2015-04-08 DIAGNOSIS — Y998 Other external cause status: Secondary | ICD-10-CM | POA: Insufficient documentation

## 2015-04-08 DIAGNOSIS — S161XXA Strain of muscle, fascia and tendon at neck level, initial encounter: Secondary | ICD-10-CM | POA: Insufficient documentation

## 2015-04-08 DIAGNOSIS — Y92410 Unspecified street and highway as the place of occurrence of the external cause: Secondary | ICD-10-CM | POA: Diagnosis not present

## 2015-04-08 MED ORDER — NAPROXEN 500 MG PO TABS: 500.0000 mg | ORAL_TABLET | Freq: Two times a day (BID) | ORAL | Status: DC

## 2015-04-08 MED ORDER — CYCLOBENZAPRINE HCL 10 MG PO TABS: 10.0000 mg | ORAL_TABLET | Freq: Two times a day (BID) | ORAL | Status: DC | PRN
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
## 2015-04-08 NOTE — Discharge Instructions (Signed)
Naprosyn for pain and inflammation. Flexeril for spasms. Follow up with primary care doctor if not improving. Return if worsening symptoms.   Concussion, Adult A concussion, or closed-head injury, is a brain injury caused by a direct blow to the head or by a quick and sudden movement (jolt) of the head or neck. Concussions are usually not life-threatening. Even so, the effects of a concussion can be serious. If you have had a concussion before, you are more likely to experience concussion-like symptoms after a direct blow to the head.  CAUSES  Direct blow to the head, such as from running into another player during a soccer game, being hit in a fight, or hitting your head on a hard surface.  A jolt of the head or neck that causes the brain to move back and forth inside the skull, such as in a car crash. SIGNS AND SYMPTOMS The signs of a concussion can be hard to notice. Early on, they may be missed by you, family members, and health care providers. You may look fine but act or feel differently. Symptoms are usually temporary, but they may last for days, weeks, or even longer. Some symptoms may appear right away while others may not show up for hours or days. Every head injury is different. Symptoms include:  Mild to moderate headaches that will not go away.  A feeling of pressure inside your head.  Having more trouble than usual:  Learning or remembering things you have heard.  Answering questions.  Paying attention or concentrating.  Organizing daily tasks.  Making decisions and solving problems.  Slowness in thinking, acting or reacting, speaking, or reading.  Getting lost or being easily confused.  Feeling tired all the time or lacking energy (fatigued).  Feeling drowsy.  Sleep disturbances.  Sleeping more than usual.  Sleeping less than usual.  Trouble falling asleep.  Trouble sleeping (insomnia).  Loss of balance or feeling lightheaded or dizzy.  Nausea or  vomiting.  Numbness or tingling.  Increased sensitivity to:  Sounds.  Lights.  Distractions.  Vision problems or eyes that tire easily.  Diminished sense of taste or smell.  Ringing in the ears.  Mood changes such as feeling sad or anxious.  Becoming easily irritated or angry for little or no reason.  Lack of motivation.  Seeing or hearing things other people do not see or hear (hallucinations). DIAGNOSIS Your health care provider can usually diagnose a concussion based on a description of your injury and symptoms. He or she will ask whether you passed out (lost consciousness) and whether you are having trouble remembering events that happened right before and during your injury. Your evaluation might include:  A brain scan to look for signs of injury to the brain. Even if the test shows no injury, you may still have a concussion.  Blood tests to be sure other problems are not present. TREATMENT  Concussions are usually treated in an emergency department, in urgent care, or at a clinic. You may need to stay in the hospital overnight for further treatment.  Tell your health care provider if you are taking any medicines, including prescription medicines, over-the-counter medicines, and natural remedies. Some medicines, such as blood thinners (anticoagulants) and aspirin, may increase the chance of complications. Also tell your health care provider whether you have had alcohol or are taking illegal drugs. This information may affect treatment.  Your health care provider will send you home with important instructions to follow.  How fast you will  recover from a concussion depends on many factors. These factors include how severe your concussion is, what part of your brain was injured, your age, and how healthy you were before the concussion.  Most people with mild injuries recover fully. Recovery can take time. In general, recovery is slower in older persons. Also, persons who  have had a concussion in the past or have other medical problems may find that it takes longer to recover from their current injury. HOME CARE INSTRUCTIONS General Instructions  Carefully follow the directions your health care provider gave you.  Only take over-the-counter or prescription medicines for pain, discomfort, or fever as directed by your health care provider.  Take only those medicines that your health care provider has approved.  Do not drink alcohol until your health care provider says you are well enough to do so. Alcohol and certain other drugs may slow your recovery and can put you at risk of further injury.  If it is harder than usual to remember things, write them down.  If you are easily distracted, try to do one thing at a time. For example, do not try to watch TV while fixing dinner.  Talk with family members or close friends when making important decisions.  Keep all follow-up appointments. Repeated evaluation of your symptoms is recommended for your recovery.  Watch your symptoms and tell others to do the same. Complications sometimes occur after a concussion. Older adults with a brain injury may have a higher risk of serious complications, such as a blood clot on the brain.  Tell your teachers, school nurse, school counselor, coach, athletic trainer, or work Freight forwarder about your injury, symptoms, and restrictions. Tell them about what you can or cannot do. They should watch for:  Increased problems with attention or concentration.  Increased difficulty remembering or learning new information.  Increased time needed to complete tasks or assignments.  Increased irritability or decreased ability to cope with stress.  Increased symptoms.  Rest. Rest helps the brain to heal. Make sure you:  Get plenty of sleep at night. Avoid staying up late at night.  Keep the same bedtime hours on weekends and weekdays.  Rest during the day. Take daytime naps or rest breaks  when you feel tired.  Limit activities that require a lot of thought or concentration. These include:  Doing homework or job-related work.  Watching TV.  Working on the computer.  Avoid any situation where there is potential for another head injury (football, hockey, soccer, basketball, martial arts, downhill snow sports and horseback riding). Your condition will get worse every time you experience a concussion. You should avoid these activities until you are evaluated by the appropriate follow-up health care providers. Returning To Your Regular Activities You will need to return to your normal activities slowly, not all at once. You must give your body and brain enough time for recovery.  Do not return to sports or other athletic activities until your health care provider tells you it is safe to do so.  Ask your health care provider when you can drive, ride a bicycle, or operate heavy machinery. Your ability to react may be slower after a brain injury. Never do these activities if you are dizzy.  Ask your health care provider about when you can return to work or school. Preventing Another Concussion It is very important to avoid another brain injury, especially before you have recovered. In rare cases, another injury can lead to permanent brain damage, brain swelling,  or death. The risk of this is greatest during the first 7-10 days after a head injury. Avoid injuries by:  Wearing a seat belt when riding in a car.  Drinking alcohol only in moderation.  Wearing a helmet when biking, skiing, skateboarding, skating, or doing similar activities.  Avoiding activities that could lead to a second concussion, such as contact or recreational sports, until your health care provider says it is okay.  Taking safety measures in your home.  Remove clutter and tripping hazards from floors and stairways.  Use grab bars in bathrooms and handrails by stairs.  Place non-slip mats on floors and in  bathtubs.  Improve lighting in dim areas. SEEK MEDICAL CARE IF:  You have increased problems paying attention or concentrating.  You have increased difficulty remembering or learning new information.  You need more time to complete tasks or assignments than before.  You have increased irritability or decreased ability to cope with stress.  You have more symptoms than before. Seek medical care if you have any of the following symptoms for more than 2 weeks after your injury:  Lasting (chronic) headaches.  Dizziness or balance problems.  Nausea.  Vision problems.  Increased sensitivity to noise or light.  Depression or mood swings.  Anxiety or irritability.  Memory problems.  Difficulty concentrating or paying attention.  Sleep problems.  Feeling tired all the time. SEEK IMMEDIATE MEDICAL CARE IF:  You have severe or worsening headaches. These may be a sign of a blood clot in the brain.  You have weakness (even if only in one hand, leg, or part of the face).  You have numbness.  You have decreased coordination.  You vomit repeatedly.  You have increased sleepiness.  One pupil is larger than the other.  You have convulsions.  You have slurred speech.  You have increased confusion. This may be a sign of a blood clot in the brain.  You have increased restlessness, agitation, or irritability.  You are unable to recognize people or places.  You have neck pain.  It is difficult to wake you up.  You have unusual behavior changes.  You lose consciousness. MAKE SURE YOU:  Understand these instructions.  Will watch your condition.  Will get help right away if you are not doing well or get worse.   This information is not intended to replace advice given to you by your health care provider. Make sure you discuss any questions you have with your health care provider.   Document Released: 08/07/2003 Document Revised: 06/07/2014 Document Reviewed:  12/07/2012 Elsevier Interactive Patient Education 2016 [employer]   Tourist information centre managerMotor Vehicle Collision It is common to have multiple bruises and sore muscles after a motor vehicle collision (MVC). These tend to feel worse for the first 24 hours. You may have the most stiffness and soreness over the first several hours. You may also feel worse when you wake up the first morning after your collision. After this point, you will usually begin to improve with each day. The speed of improvement often depends on the severity of the collision, the number of injuries, and the location and nature of these injuries. HOME CARE INSTRUCTIONS  Put ice on the injured area.  Put ice in a plastic bag.  Place a towel between your skin and the bag.  Leave the ice on for 15-20 minutes, 3-4 times a day, or as directed by your health care provider.  Drink enough fluids to keep your urine clear or pale  yellow. Do not drink alcohol.  Take a warm shower or bath once or twice a day. This will increase blood flow to sore muscles.  You may return to activities as directed by your caregiver. Be careful when lifting, as this may aggravate neck or back pain.  Only take over-the-counter or prescription medicines for pain, discomfort, or fever as directed by your caregiver. Do not use aspirin. This may increase bruising and bleeding. SEEK IMMEDIATE MEDICAL CARE IF:  You have numbness, tingling, or weakness in the arms or legs.  You develop severe headaches not relieved with medicine.  You have severe neck pain, especially tenderness in the middle of the back of your neck.  You have changes in bowel or bladder control.  There is increasing pain in any area of the body.  You have shortness of breath, light-headedness, dizziness, or fainting.  You have chest pain.  You feel sick to your stomach (nauseous), throw up (vomit), or sweat.  You have increasing abdominal discomfort.  There is blood in your urine, stool, or  vomit.  You have pain in your shoulder (shoulder strap areas).  You feel your symptoms are getting worse. MAKE SURE YOU:  Understand these instructions.  Will watch your condition.  Will get help right away if you are not doing well or get worse.   This information is not intended to replace advice given to you by your health care provider. Make sure you discuss any questions you have with your health care provider.   Document Released: 05/17/2005 Document Revised: 06/07/2014 Document Reviewed: 10/14/2010 Elsevier Interactive Patient Education [employer]
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
## 2015-06-10 ENCOUNTER — Telehealth: Payer: Self-pay

## 2015-06-10 NOTE — Telephone Encounter (Signed)
Spoke with patient he declines flu vaccine.
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
## 2016-05-28 IMAGING — CT CT HEAD W/O CM
3 of 6 series · 14 of 47 positions shown, 16 images · non-contrast
Comparison: MRI of the brain performed 04/03/2012

CLINICAL DATA: Status post fall from balcony, of 18-19 feet.
Lethargy. Concern for head or cervical spine injury. Initial
encounter.

EXAM:
CT HEAD WITHOUT CONTRAST
CT CERVICAL SPINE WITHOUT CONTRAST
TECHNIQUE: Multidetector CT imaging of the head and cervical spine was
performed following the standard protocol without intravenous
contrast. Multiplanar CT image reconstructions of the cervical spine
were also generated.

[Series 302: soft tissue, idose (2) · axial · 0.39mm/px · z∈[+61,+255]mm · 8 of 123 slices shown, 10 images]
[im 13/123  brain]
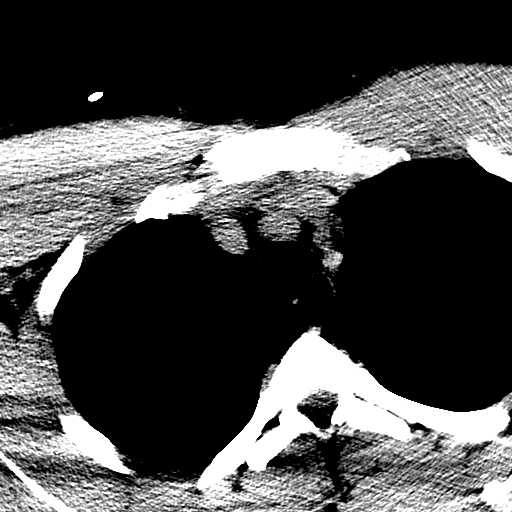
[im 13/123  bone]
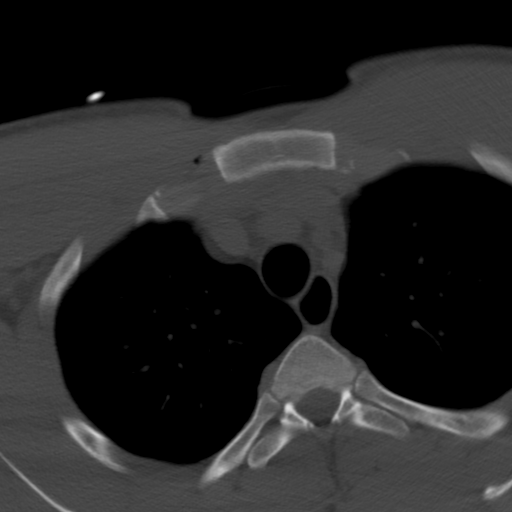
[im 25/123  brain]
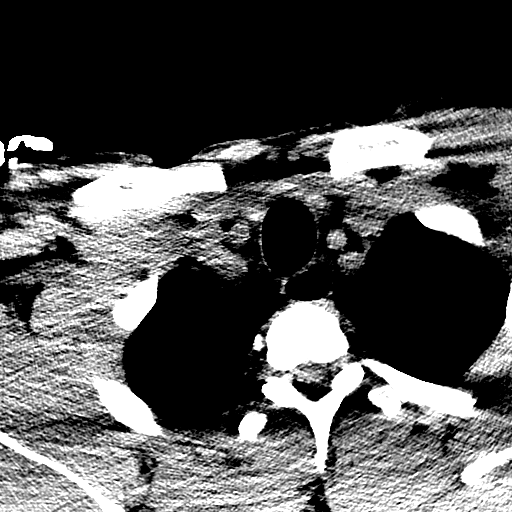
[im 37/123  brain]
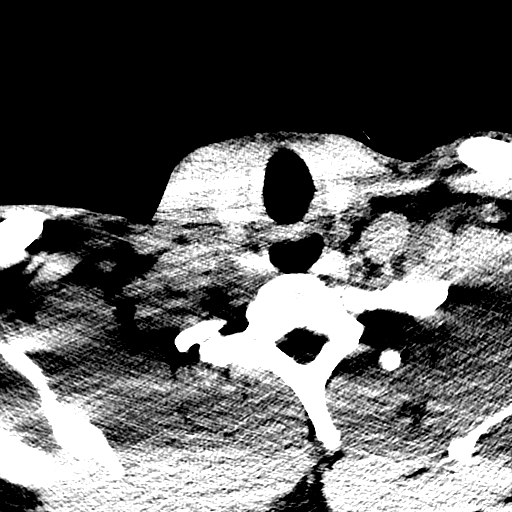
[im 49/123  brain]
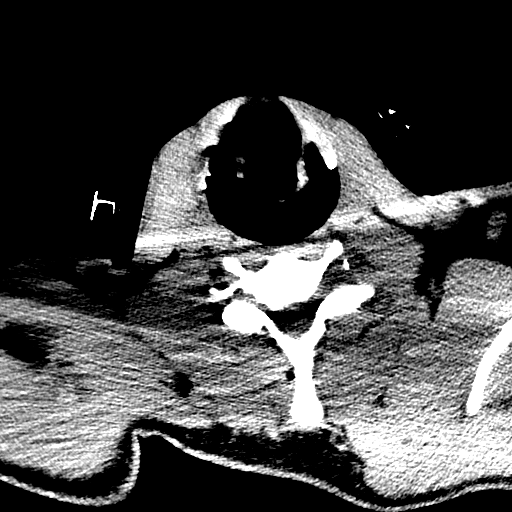
[im 74/123  brain]
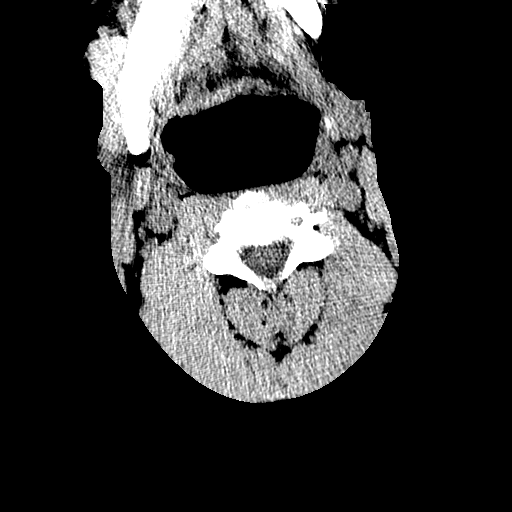
[im 74/123  bone]
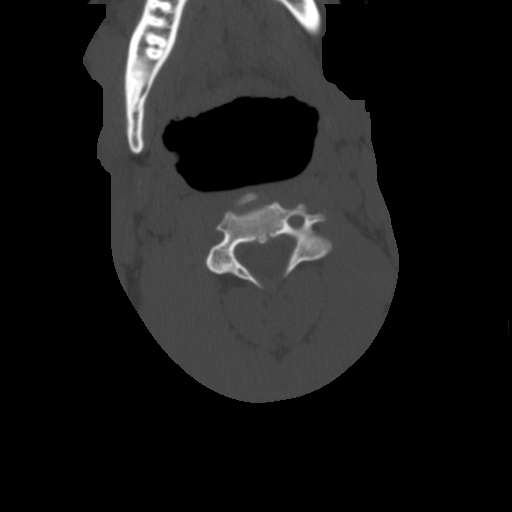
[im 86/123  brain]
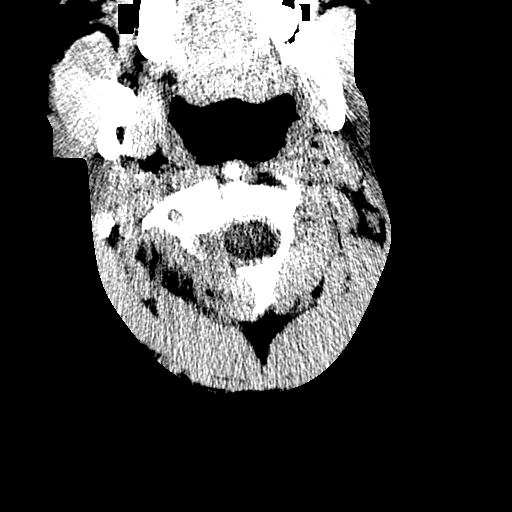
[im 98/123  brain]
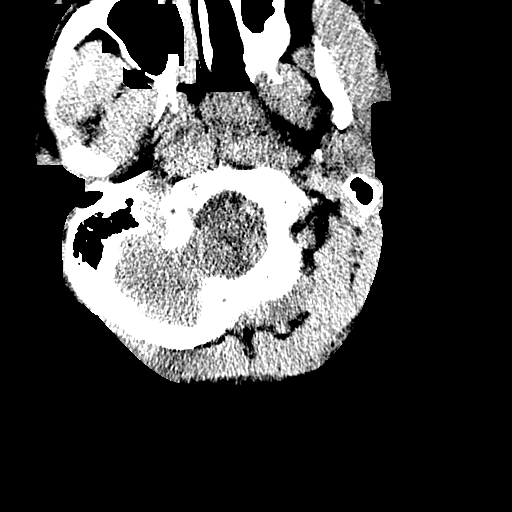
[im 110/123  brain]
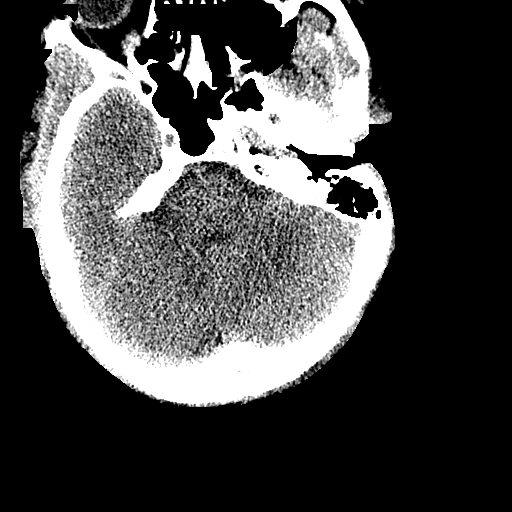

[Series 304: coronal, idose (2) · coronal · 0.35mm/px · 3 of 71 slices shown]
[im 24/71  brain]
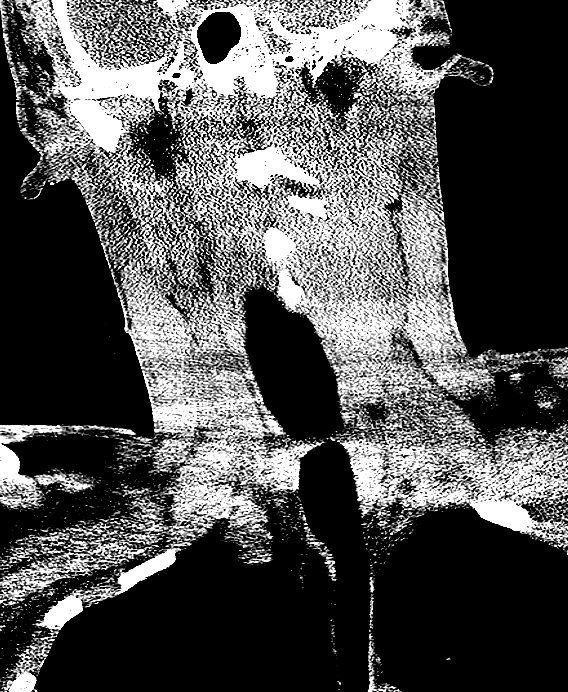
[im 32/71  brain]
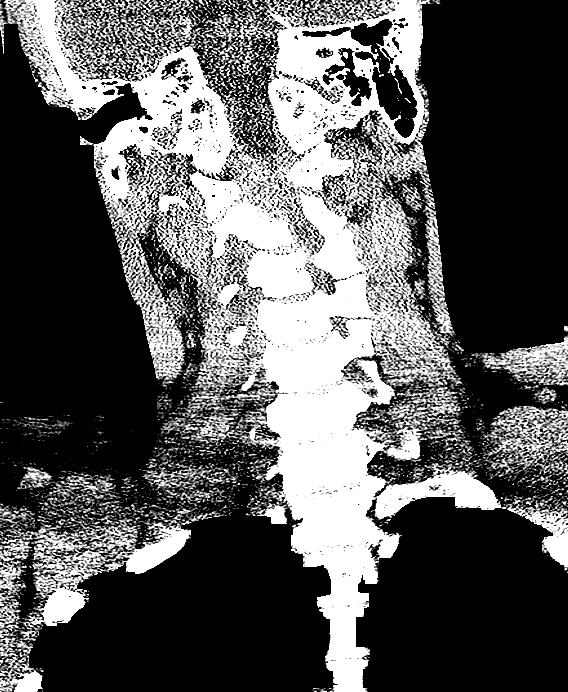
[im 39/71  brain]
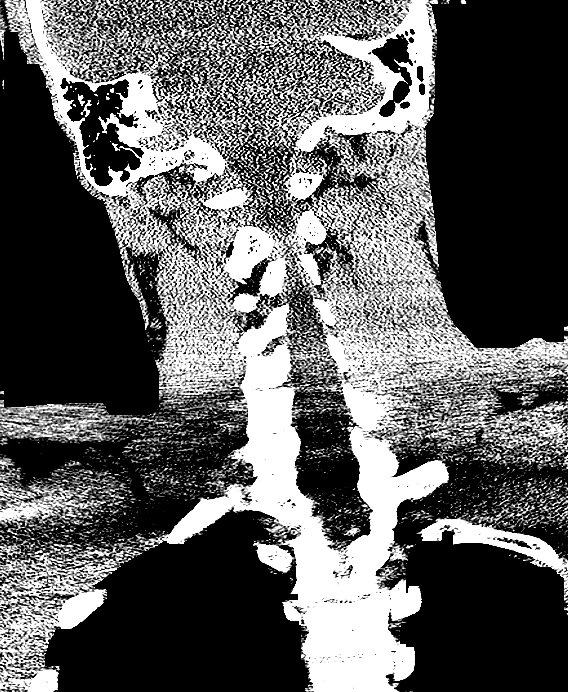

[Series 305: sagittal, idose (2) · sagittal · 0.34mm/px · 3 of 83 slices shown]
[im 28/83  brain]
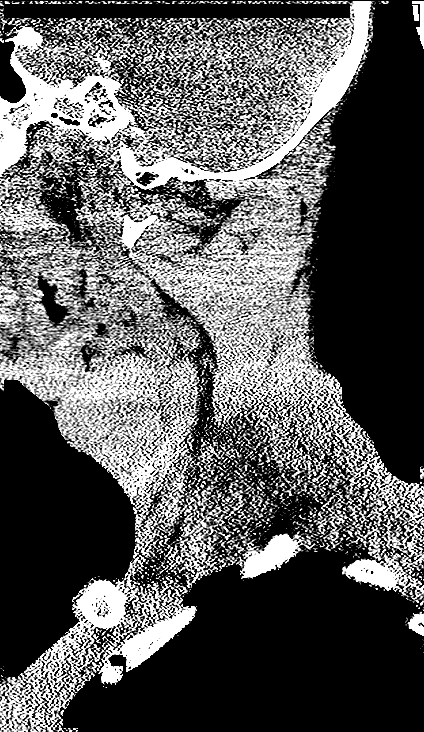
[im 42/83  brain]
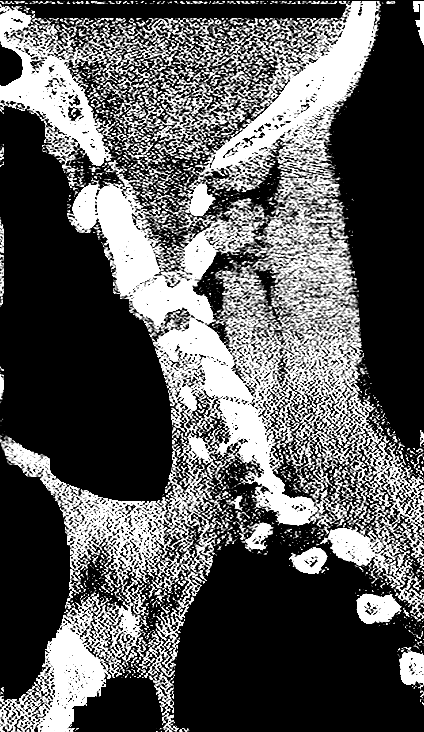
[im 55/83  brain]
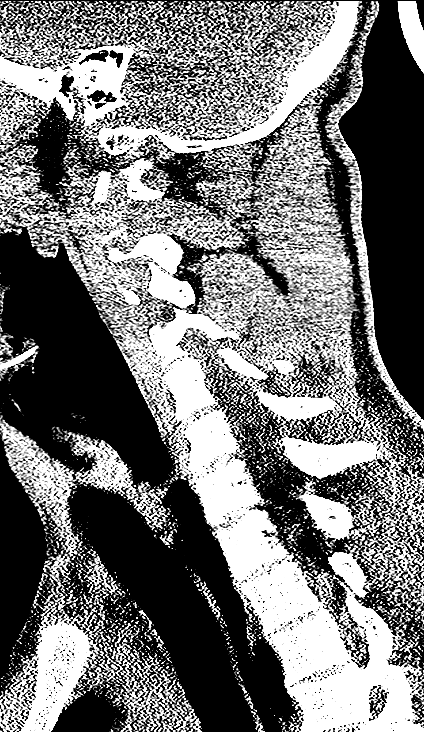

[14 of 47 positions shown; findings below may reference images not displayed]

FINDINGS: CT HEAD FINDINGS

There is no evidence of acute infarction, mass lesion, or intra- or
extra-axial hemorrhage on CT.

The posterior fossa, including the cerebellum, brainstem and fourth
ventricle, is within normal limits. The third and lateral
ventricles, and basal ganglia are unremarkable in appearance. The
cerebral hemispheres are symmetric in appearance, with normal
gray-white differentiation. No mass effect or midline shift is seen.

There is no evidence of fracture; visualized osseous structures are
unremarkable in appearance. The visualized portions of the orbits
are within normal limits. The paranasal sinuses and mastoid air
cells are well-aerated. Mild soft tissue swelling is suggested about
the vertex.

CT CERVICAL SPINE FINDINGS

There is no evidence of fracture or subluxation. Vertebral bodies
demonstrate normal height and alignment. Intervertebral disc spaces
are preserved. Prevertebral soft tissues are within normal limits.
The visualized neural foramina are grossly unremarkable.

The thyroid gland is unremarkable in appearance. The visualized lung
apices are clear. No significant soft tissue abnormalities are seen.
Prominence of the hypopharynx is thought to be transient in nature.
IMPRESSION: 1. No evidence of traumatic intracranial injury or fracture.
2. No evidence of fracture or subluxation along the cervical spine.
3. Mild soft tissue swelling suggested about the vertex.

## 2016-10-12 ENCOUNTER — Ambulatory Visit: Payer: BLUE CROSS/BLUE SHIELD | Admitting: Urgent Care

## 2016-11-25 ENCOUNTER — Ambulatory Visit (INDEPENDENT_AMBULATORY_CARE_PROVIDER_SITE_OTHER): Payer: BLUE CROSS/BLUE SHIELD | Admitting: Family Medicine

## 2016-11-25 ENCOUNTER — Encounter: Payer: Self-pay | Admitting: Family Medicine

## 2016-11-25 VITALS — BP 120/80 | HR 82 | Temp 98.6°F | Wt 161.8 lb

## 2016-11-25 DIAGNOSIS — H60332 Swimmer's ear, left ear: Secondary | ICD-10-CM | POA: Diagnosis not present

## 2016-11-25 MED ORDER — NEOMYCIN-POLYMYXIN-HC 1 % OT SOLN: 3.0000 [drp] | Freq: Four times a day (QID) | OTIC | 0 refills | Status: DC

## 2016-11-25 NOTE — Progress Notes (Signed)
Subjective: Chief Complaint  Patient presents with  . ear infection    left ear pain- tender and been going on for 3 days     Hunter Estes is a 26 y.o. male who presents for evaluation of left ear pain. Symptoms have been present for 3 days. He also notes mild pain in the left ear. He does not have a history of ear infections. He does have a history of recent swimming.  No other aggravating or relieving factors.  ROS as in subjective   Objective: Vitals:   11/25/16 1409  BP: 120/80  Pulse: 82  Temp: 98.6 F (37 C)    General appearance: alert, no distress, WD/WN Ears: right external ear normal, left external ear mild tenderness canal with erythema and edema , right TM normal, left TM normal. No mastoid tenderness.  HEENT: normocephalic, sclerae anicteric, nares patent, no discharge or erythema pharynx normal Oral cavity: MMM, no lesions Neck: supple, no lymphadenopathy, no thyromegaly, no masses    Assessment: Acute swimmer's ear of left side - Plan: NEOMYCIN-POLYMYXIN-HYDROCORTISONE (CORTISPORIN) 1 % SOLN OTIC solution   Plan: Treatment: Cortisporin Otic. OTC analgesia as needed. Water exclusion from affected ear until symptoms resolve. Follow up

## 2016-11-25 NOTE — Patient Instructions (Signed)

## 2017-02-16 ENCOUNTER — Ambulatory Visit (INDEPENDENT_AMBULATORY_CARE_PROVIDER_SITE_OTHER): Payer: BLUE CROSS/BLUE SHIELD | Admitting: Medical

## 2017-02-16 ENCOUNTER — Other Ambulatory Visit: Payer: Self-pay

## 2017-02-16 ENCOUNTER — Encounter: Payer: Self-pay | Admitting: Medical

## 2017-02-16 VITALS — BP 122/78 | HR 98 | Ht 67.5 in | Wt 159.0 lb

## 2017-02-16 DIAGNOSIS — Z113 Encounter for screening for infections with a predominantly sexual mode of transmission: Secondary | ICD-10-CM | POA: Diagnosis not present

## 2017-02-16 DIAGNOSIS — R7989 Other specified abnormal findings of blood chemistry: Secondary | ICD-10-CM

## 2017-02-16 DIAGNOSIS — Z0000 Encounter for general adult medical examination without abnormal findings: Secondary | ICD-10-CM | POA: Diagnosis not present

## 2017-02-16 DIAGNOSIS — K589 Irritable bowel syndrome without diarrhea: Secondary | ICD-10-CM | POA: Insufficient documentation

## 2017-02-16 DIAGNOSIS — Z800 Family history of malignant neoplasm of digestive organs: Secondary | ICD-10-CM | POA: Insufficient documentation

## 2017-02-16 DIAGNOSIS — Z2821 Immunization not carried out because of patient refusal: Secondary | ICD-10-CM | POA: Insufficient documentation

## 2017-02-16 DIAGNOSIS — R109 Unspecified abdominal pain: Secondary | ICD-10-CM | POA: Diagnosis not present

## 2017-02-16 DIAGNOSIS — R945 Abnormal results of liver function studies: Secondary | ICD-10-CM

## 2017-02-16 DIAGNOSIS — R413 Other amnesia: Secondary | ICD-10-CM | POA: Diagnosis not present

## 2017-02-16 DIAGNOSIS — R450 Nervousness: Secondary | ICD-10-CM

## 2017-02-16 DIAGNOSIS — E348 Other specified endocrine disorders: Secondary | ICD-10-CM | POA: Diagnosis not present

## 2017-02-16 LAB — POCT URINALYSIS DIP (PROADVANTAGE DEVICE)
Bilirubin, UA: NEGATIVE
Blood, UA: NEGATIVE
GLUCOSE UA: NEGATIVE mg/dL
Ketones, POC UA: NEGATIVE mg/dL
Leukocytes, UA: NEGATIVE
NITRITE UA: NEGATIVE
PROTEIN UA: NEGATIVE mg/dL
Specific Gravity, Urine: NEGATIVE
UUROB: NEGATIVE
pH, UA: 6 (ref 5.0–8.0)

## 2017-02-16 MED ORDER — ELUXADOLINE 75 MG PO TABS: 1.0000 | ORAL_TABLET | Freq: Two times a day (BID) | ORAL | 2 refills | Status: DC

## 2017-02-16 NOTE — Progress Notes (Signed)
Subjective:   HPI  Hunter Estes is a 26 y.o. male who presents for physical Chief Complaint  Patient presents with  . Annual Exam    physical, having stomach issues    Medical care team includes: Denita Lung, MD and Dorothea Ogle, PA-C here for primary care Dentist Eye doctor Dr. Carol Ada, GI   Concerns: Has had several year hx/o abdominal pains, diarrhea, has to use bathroom immediately after eating, sometimes has to pull over on side of road to defecate.    He had some recent labs through [facility], reportedly elevated LFTs, concern for alcoholic gastritis, soy and gluten and dairy sensitivities.    He drinks every day, parties often, wouldn't give exact amount  Still smokes marijuana some.   Gets jittery, anxious, memory not so great    Reviewed their medical, surgical, family, social, medication, and allergy history and updated chart as appropriate.  Past Medical History:  Diagnosis Date  . Abdominal distention 07/31/2014  . ADHD (attention deficit hyperactivity disorder)   . Eczema   . 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 Oppositional defiant disorder     Past Surgical History:  Procedure Laterality Date  . COLONOSCOPY  10/2012   Dr. Carol Ada  . right hand surgery  2014    Social History   Social History  . Marital status: Single    Spouse name: N/A  . Number of children: N/A  . Years of education: N/A   Occupational History  . Not on file.   Social History Main Topics  . Smoking status: Never Smoker  . Smokeless tobacco: Never Used     Comment: smokes weed  . Alcohol use 14.4 oz/week    24 Cans of beer per week     Comment: liquor on the weekends  . Drug use: Yes     Comment: no marijuana for the past 2 month  . Sexual activity: Yes   Other Topics Concern  . 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   01/2017    Family History   Problem Relation Age of Onset  . Heart disease Paternal Grandmother   . Hyperlipidemia Paternal Grandfather   . Cancer Mother 44       colon  . Cancer Paternal Aunt        brain tumor  . Stroke Paternal Aunt   . Cancer Maternal Grandfather        prostate     Current Outpatient Prescriptions:  .  Eluxadoline (VIBERZI) 75 MG TABS, Take 1 tablet by mouth 2 (two) times daily., Disp: 60 tablet, Rfl: 2  No Known Allergies   Review of Systems Constitutional: -fever, -chills, -sweats, -unexpected weight change, -decreased appetite, -fatigue Allergy: -sneezing, -itching, -congestion Dermatology: -changing moles, --rash, -lumps ENT: -runny nose, -ear pain, -sore throat, -hoarseness, -sinus pain, -teeth pain, - ringing in ears, -hearing loss, -nosebleeds Cardiology: -chest pain, -palpitations, -swelling, -difficulty breathing when lying flat, -waking up short of breath Respiratory: -cough, -shortness of breath, -difficulty breathing with exercise or exertion, -wheezing, -coughing up blood Gastroenterology: +abdominal pain, -nausea, -vomiting, +diarrhea, -constipation, -blood in stool, -changes in bowel movement, -difficulty swallowing or eating Hematology: -bleeding, -bruising  Musculoskeletal: -joint aches, -muscle aches, -joint swelling, -back pain, -neck pain, -cramping, -changes in gait Ophthalmology: denies vision changes, eye redness, itching, discharge Urology: -burning with urination, -difficulty urinating, -blood in urine, -urinary frequency, -urgency, -incontinence Neurology: -headache, -  weakness, -tingling, -numbness, -memory loss, -falls, -dizziness Psychology: -depressed mood, -agitation, -sleep problems     Objective:   BP 122/78   Pulse 98   Ht 5' 7.5" (1.715 m)   Wt 159 lb (72.1 kg)   SpO2 98%   BMI 24.54 kg/m   General appearance: alert, no distress, WD/WN, African American male Skin: numerous tattoos, bilat arms, chest, back, abdomen, scar right cheek aprox 11m  diameter, surgical scars right hand HEENT: normocephalic, conjunctiva/corneas normal, sclerae anicteric, PERRLA, EOMi, nares patent, no discharge or erythema, pharynx normal Oral cavity: MMM, tongue normal, teeth in good repair Neck: supple, no lymphadenopathy, no thyromegaly, no masses, normal ROM, no bruits Chest: non tender, normal shape and expansion Heart: RRR, normal S1, S2, no murmurs Lungs: CTA bilaterally, no wheezes, rhonchi, or rales Abdomen: +bs, soft, mild generalized tenderness, otherwise non tender, non distended, no masses, no hepatomegaly, no splenomegaly, no bruits Back: non tender, normal ROM, no scoliosis Musculoskeletal: upper extremities non tender, no obvious deformity, normal ROM throughout, lower extremities non tender, no obvious deformity, normal ROM throughout Extremities: no edema, no cyanosis, no clubbing Pulses: 2+ symmetric, upper and lower extremities, normal cap refill Neurological: alert, oriented x 3, CN2-12 intact, strength normal upper extremities and lower extremities, sensation normal throughout, DTRs 2+ throughout, no cerebellar signs, gait normal Psychiatric: normal affect, behavior normal, pleasant  GU: normal male external genitalia,circumcised, nontender, no masses, no hernia, no lymphadenopathy Rectal: deferred   Assessment and Plan :    Encounter Diagnoses  Name Primary?  . Routine general medical examination at a health care facility Yes  . Irritable bowel syndrome, unspecified type   . Elevated LFTs   . Screen for STD (sexually transmitted disease)   . Influenza vaccination declined   . Pineal gland cyst   . Family history of colon cancer in mother   . Jittery   . Memory change   . Abdominal discomfort     Physical exam - discussed and counseled on healthy lifestyle, diet, exercise, preventative care, vaccinations, sick and well care, proper use of emergency dept and after hours care, and addressed their concerns.    Health  screening: See your eye doctor yearly for routine vision care. See your dentist yearly for routine dental care including hygiene visits twice yearly.  Discussed STD testing, discussed prevention, condom use, means of transmission  Cancer screening Discussed monthly self testicular exam  Vaccinations: Counseled on the following vaccines:  influenza and Tdap  Acute issues discussed: Elevated LFT - [facility]   Additional labs today.  Separate significant chronic issues discussed: IBS - counseled on diet, alcohol reduction, avoiding food triggers. Alcohol excess - counseled on dangers of alcohol and he really needs to cut way back.  I advised that he has an alcohol problem at this point.   Pineal gland cyst - followed by [facility], reviewed 06/2015 MRI abdominal pain - begin trial of Viberzi samples   [place] seen today for annual exam.  Diagnoses and all orders for this visit:  Routine general medical examination at a health care facility -     POCT Urinalysis DIP (Proadvantage Device) -     CBC -     TSH -     Hepatic function panel -     Renal Function Panel -     HIV antibody -     RPR -     Hepatitis C  antibody -     Hepatitis B surface antibody -     Hepatitis B surface antigen -     C. trachomatis/N. gonorrhoeae RNA  Irritable bowel syndrome, unspecified type  Elevated LFTs -     Hepatic function panel  Screen for STD (sexually transmitted disease) -     HIV antibody -     RPR -     Hepatitis C antibody -     Hepatitis B surface antibody -     Hepatitis B surface antigen -     C. trachomatis/N. gonorrhoeae RNA  Influenza vaccination declined  Pineal gland cyst  Family history of colon cancer in mother  Jittery  Memory change  Abdominal discomfort  Other orders -     Eluxadoline (VIBERZI) 75 MG TABS; Take 1 tablet by mouth 2 (two) times daily.    Follow-up  pending labs, yearly for physical

## 2017-02-16 NOTE — Patient Instructions (Signed)
Encounter Diagnoses  Name Primary?  . Routine general medical examination at a health care facility Yes  . Irritable bowel syndrome, unspecified type   . Elevated LFTs   . Screen for STD (sexually transmitted disease)   . Influenza vaccination declined   . Pineal gland cyst   . Family history of colon cancer in mother   . Jittery   . Memory change   . Abdominal discomfort    Recommendations: See your eye doctor yearly for routine vision care.  See your dentist yearly for routine dental care including hygiene visits twice yearly.  Please get me a copy of the recent labs you had done  I strongly recommend you cut way back on alcohol.   It sounds like its is a problem, possible dependency.  Excess alcohol can certainly upset your stomach, can damage the liver and the heart, can interfere with memory, cause jitteriness and other symptoms  I recommend not drinking every day, and no more than 2 drinks on a given day  Begin samples of Viberzi twice daily to help with stomach spasms  We will call with lab results  exercise most days per week    Alcohol Use Disorder Alcohol use disorder is when your drinking disrupts your daily life. When you have this condition, you drink too much alcohol and you cannot control your drinking. Alcohol use disorder can cause serious problems with your physical health. It can affect your brain, heart, liver, pancreas, immune system, stomach, and intestines. Alcohol use disorder can increase your risk for certain cancers and cause problems with your mental health, such as depression, anxiety, psychosis, delirium, and dementia. People with this disorder risk hurting themselves and others. What are the causes? This condition is caused by drinking too much alcohol over time. It is not caused by drinking too much alcohol only one or two times. Some people with this condition drink alcohol to cope with or escape from negative life events. Others drink to relieve  pain or symptoms of mental illness. What increases the risk? You are more likely to develop this condition if:  You have a family history of alcohol use disorder.  Your culture encourages drinking to the point of intoxication, or makes alcohol easy to get.  You had a mood or conduct disorder in childhood.  You have been a victim of abuse.  You are an adolescent and: ? You have poor grades or difficulties in school. ? Your caregivers do not talk to you about saying no to alcohol, or supervise your activities. ? You are impulsive or you have trouble with self-control.  What are the signs or symptoms? Symptoms of this condition include:  Drinkingmore than you want to.  Drinking for longer than you want to.  Trying several times to drink less or to control your drinking.  Spending a lot of time getting alcohol, drinking, or recovering from drinking.  Craving alcohol.  Having problems at work, at school, or at home due to drinking.  Having problems in relationships due to drinking.  Drinking when it is dangerous to drink, such as before driving a car.  Continuing to drink even though you know you might have a physical or mental problem related to drinking.  Needing more and more alcohol to get the same effect you want from the alcohol (building up tolerance).  Having symptoms of withdrawal when you stop drinking. Symptoms of withdrawal include: ? Fatigue. ? Nightmares. ? Trouble sleeping. ? Depression. ? Anxiety. ? Fever. ?  Seizures. ? Severe confusion. ? Feeling or seeing things that are not there (hallucinations). ? Tremors. ? Rapid heart rate. ? Rapid breathing. ? High blood pressure.  Drinking to avoid symptoms of withdrawal.  How is this diagnosed? This condition is diagnosed with an assessment. Your health care provider may start the assessment by asking three or four questions about your drinking. Your health care provider may perform a physical exam or  do lab tests to see if you have physical problems resulting from alcohol use. She or he may refer you to a mental health professional for evaluation. How is this treated? Some people with alcohol use disorder are able to reduce their alcohol use to low-risk levels. Others need to completely quit drinking alcohol. When necessary, mental health professionals with specialized training in substance use treatment can help. Your health care provider can help you decide how severe your alcohol use disorder is and what type of treatment you need. The following forms of treatment are available:  Detoxification. Detoxification involves quitting drinking and using prescription medicines within the first week to help lessen withdrawal symptoms. This treatment is important for people who have had withdrawal symptoms before and for heavy drinkers who are likely to have withdrawal symptoms. Alcohol withdrawal can be dangerous, and in severe cases, it can cause death. Detoxification may be provided in a home, community, or primary care setting, or in a hospital or substance use treatment facility.  Counseling. This treatment is also called talk therapy. It is provided by substance use treatment counselors. A counselor can address the reasons you use alcohol and suggest ways to keep you from drinking again or to prevent problem drinking. The goals of talk therapy are to: ? Find healthy activities and ways for you to cope with stress. ? Identify and avoid the things that trigger your alcohol use. ? Help you learn how to handle cravings.  Medicines.Medicines can help treat alcohol use disorder by: ? Decreasing alcohol cravings. ? Decreasing the positive feeling you have when you drink alcohol. ? Causing an uncomfortable physical reaction when you drink alcohol (aversion therapy).  Support groups. Support groups are led by people who have quit drinking. They provide emotional support, advice, and guidance.  These  forms of treatment are often combined. Some people with this condition benefit from a combination of treatments provided by specialized substance use treatment centers. Follow these instructions at home:  Take over-the-counter and prescription medicines only as told by your health care provider.  Check with your health care provider before starting any new medicines.  Ask friends and family members not to offer you alcohol.  Avoid situations where alcohol is served, including gatherings where others are drinking alcohol.  Create a plan for what to do when you are tempted to use alcohol.  Find hobbies or activities that you enjoy that do not include alcohol.  Keep all follow-up visits as told by your health care provider. This is important. How is this prevented?  If you drink, limit alcohol intake to no more than 1 drink a day for nonpregnant women and 2 drinks a day for men. One drink equals 12 oz of beer, 5 oz of wine, or 1 oz of hard liquor.  If you have a mental health condition, get treatment and support.  Do not give alcohol to adolescents.  If you are an adolescent: ? Do not drink alcohol. ? Do not be afraid to say no if someone offers you alcohol. Speak up about why  you do not want to drink. You can be a positive role model for your friends and set a good example for those around you by not drinking alcohol. ? If your friends drink, spend time with others who do not drink alcohol. Make new friends who do not use alcohol. ? Find healthy ways to manage stress and emotions, such as meditation or deep breathing, exercise, spending time in nature, listening to music, or talking with a trusted friend or family member. Contact a health care provider if:  You are not able to take your medicines as told.  Your symptoms get worse.  You return to drinking alcohol (relapse) and your symptoms get worse. Get help right away if:  You have thoughts about hurting yourself or others. If  you ever feel like you may hurt yourself or others, or have thoughts about taking your own life, get help right away. You can go to your nearest emergency department or call:  Your local emergency services (911 in the [place]  A suicide crisis helpline, such as the National Suicide Prevention Lifeline at 717-696-7478. This is open 24 hours a day.  Summary  Alcohol use disorder is when your drinking disrupts your daily life. When you have this condition, you drink too much alcohol and you cannot control your drinking.  Treatment may include detoxification, counseling, medicine, and support groups.  Ask friends and family members not to offer you alcohol. Avoid situations where alcohol is served.  Get help right away if you have thoughts about hurting yourself or others. This information is not intended to replace advice given to you by your health care provider. Make sure you discuss any questions you have with your health care provider. Document Released: 06/24/2004 Document Revised: 02/12/2016 Document Reviewed: 02/12/2016 Elsevier Interactive Patient Education  [employer].

## 2017-02-17 LAB — HEPATITIS B SURFACE ANTIGEN: HEP B S AG: NONREACTIVE

## 2017-02-17 LAB — HEPATIC FUNCTION PANEL
AG RATIO: 1.6 (calc) (ref 1.0–2.5)
ALT: 21 U/L (ref 9–46)
AST: 21 U/L (ref 10–40)
Albumin: 4.7 g/dL (ref 3.6–5.1)
Alkaline phosphatase (APISO): 61 U/L (ref 40–115)
Bilirubin, Direct: 0.1 mg/dL (ref 0.0–0.2)
GLOBULIN: 2.9 g/dL (ref 1.9–3.7)
Indirect Bilirubin: 0.3 mg/dL (calc) (ref 0.2–1.2)
Total Bilirubin: 0.4 mg/dL (ref 0.2–1.2)
Total Protein: 7.6 g/dL (ref 6.1–8.1)

## 2017-02-17 LAB — RENAL FUNCTION PANEL
ALBUMIN MSPROF: 4.7 g/dL (ref 3.6–5.1)
BUN: 10 mg/dL (ref 7–25)
CALCIUM: 9.3 mg/dL (ref 8.6–10.3)
CO2: 30 mmol/L (ref 20–32)
Chloride: 104 mmol/L (ref 98–110)
Creat: 1.09 mg/dL (ref 0.60–1.35)
GLUCOSE: 85 mg/dL (ref 65–99)
POTASSIUM: 3.9 mmol/L (ref 3.5–5.3)
Phosphorus: 3.5 mg/dL (ref 2.5–4.5)
Sodium: 140 mmol/L (ref 135–146)

## 2017-02-17 LAB — CBC
HCT: 45.7 % (ref 38.5–50.0)
HEMOGLOBIN: 15.2 g/dL (ref 13.2–17.1)
MCH: 29 pg (ref 27.0–33.0)
MCHC: 33.3 g/dL (ref 32.0–36.0)
MCV: 87 fL (ref 80.0–100.0)
MPV: 11.1 fL (ref 7.5–12.5)
Platelets: 179 10*3/uL (ref 140–400)
RBC: 5.25 10*6/uL (ref 4.20–5.80)
RDW: 12.4 % (ref 11.0–15.0)
WBC: 5.4 10*3/uL (ref 3.8–10.8)

## 2017-02-17 LAB — HEPATITIS C ANTIBODY
Hepatitis C Ab: NONREACTIVE
SIGNAL TO CUT-OFF: 0.15 (ref <1.00)

## 2017-02-17 LAB — HEPATITIS B SURFACE ANTIBODY, QUANTITATIVE: HEPATITIS B-POST: 51 m[IU]/mL (ref >=10)

## 2017-02-17 LAB — RPR: RPR Ser Ql: NONREACTIVE

## 2017-02-17 LAB — C. TRACHOMATIS/N. GONORRHOEAE RNA
C. TRACHOMATIS RNA, TMA: NOT DETECTED
N. GONORRHOEAE RNA, TMA: NOT DETECTED

## 2017-02-17 LAB — TSH: TSH: 1.3 m[IU]/L (ref 0.40–4.50)

## 2017-02-17 LAB — HIV ANTIBODY (ROUTINE TESTING W REFLEX): HIV 1&2 Ab, 4th Generation: NONREACTIVE

## 2017-10-31 ENCOUNTER — Ambulatory Visit (INDEPENDENT_AMBULATORY_CARE_PROVIDER_SITE_OTHER): Payer: Self-pay | Admitting: Urgent Care

## 2017-10-31 ENCOUNTER — Ambulatory Visit (INDEPENDENT_AMBULATORY_CARE_PROVIDER_SITE_OTHER): Payer: Self-pay

## 2017-10-31 ENCOUNTER — Encounter: Payer: Self-pay | Admitting: Urgent Care

## 2017-10-31 VITALS — BP 120/80 | HR 68 | Temp 98.8°F | Resp 16 | Ht 67.5 in | Wt 168.6 lb

## 2017-10-31 DIAGNOSIS — Z800 Family history of malignant neoplasm of digestive organs: Secondary | ICD-10-CM

## 2017-10-31 DIAGNOSIS — F1290 Cannabis use, unspecified, uncomplicated: Secondary | ICD-10-CM

## 2017-10-31 DIAGNOSIS — S30812A Abrasion of penis, initial encounter: Secondary | ICD-10-CM

## 2017-10-31 DIAGNOSIS — R000 Tachycardia, unspecified: Secondary | ICD-10-CM

## 2017-10-31 DIAGNOSIS — Z7289 Other problems related to lifestyle: Secondary | ICD-10-CM

## 2017-10-31 DIAGNOSIS — Z114 Encounter for screening for human immunodeficiency virus [HIV]: Secondary | ICD-10-CM

## 2017-10-31 DIAGNOSIS — Z789 Other specified health status: Secondary | ICD-10-CM

## 2017-10-31 DIAGNOSIS — Z113 Encounter for screening for infections with a predominantly sexual mode of transmission: Secondary | ICD-10-CM

## 2017-10-31 DIAGNOSIS — F1090 Alcohol use, unspecified, uncomplicated: Secondary | ICD-10-CM

## 2017-10-31 DIAGNOSIS — R0602 Shortness of breath: Secondary | ICD-10-CM

## 2017-10-31 NOTE — Progress Notes (Signed)
  MRN: [id number]  Subjective:   [name] is a 27 y.o. male presenting for ~1 year history of shortness of breath with increased activity, heart racing as well. Has had a Holter monitor placed ~2 years ago. Wore this for a week, had normal findings. Denies dizziness, headaches, chest pain, wheezing, n/v, abdominal pain, rashes, confusion, sore throat, cough, dysuria, urinary frequency, hematuria, pelvic pain. Also reports smoking marijuana 2-3 times per day for the past 10 years. Denies smoking cigarettes. Drinks 8-10 alcohol per week, drinks 3-4 drinks every other day. Drinks water but less than the recommended amount. Works with pressure washing, has physically demanding job. Family history is positive for colon cancer in her mother at age 29. Hunter Estes is not currently taking any medications and has No Known Allergies. Denies chronic medical conditions. Denies past surgical history.   Objective:   Vitals: BP 120/80   Pulse 68   Temp 98.8 F (37.1 C) (Oral)   Resp 16   Ht 5' 7.5" (1.715 m)   Wt 168 lb 9.6 oz (76.5 kg)   SpO2 99%   BMI 26.02 kg/m   Physical Exam  Constitutional: He is oriented to person, place, and time. He appears well-developed and well-nourished.  HENT:  Mouth/Throat: Oropharynx is clear and moist.  Eyes: Pupils are equal, round, and reactive to light. EOM are normal. Right eye exhibits no discharge. Left eye exhibits no discharge. No scleral icterus.  Neck: Normal range of motion. Neck supple.  Cardiovascular: Normal rate, regular rhythm and intact distal pulses. Exam reveals no gallop and no friction rub.  No murmur heard. Pulmonary/Chest: No stridor. No respiratory distress. He has no wheezes. He has no rales.  Genitourinary: Circumcised.     Neurological: He is alert and oriented to person, place, and time.  Skin: Skin is warm and dry.  Psychiatric: He has a normal mood and affect.    Dg Chest 2 View  Result Date: 10/31/2017 CLINICAL DATA:  Shortness  of breath.  Tachycardia. EXAM: CHEST - 2 VIEW COMPARISON:  12/19/2014 FINDINGS: Heart size is normal. Mediastinal shadows are normal. The lungs are clear. No bronchial thickening. No infiltrate, mass, effusion or collapse. Pulmonary vascularity is normal. No bony abnormality. IMPRESSION: Normal chest Electronically Signed   By: Paulina FusiMark  Shogry M.D.   On: 10/31/2017 15:17     Assessment and Plan :   Shortness of breath - Plan: Comprehensive metabolic panel, DG Chest 2 View  Racing heart beat - Plan: CBC, DG Chest 2 View  Screening for HIV (human immunodeficiency virus) - Plan: HIV antibody  Routine screening for STI (sexually transmitted infection) - Plan: HIV antibody, RPR, GC/Chlamydia Probe Amp, Trichomonas vaginalis, RNA  Family history of colon cancer  Alcohol use  Marijuana use  Abrasion of penis, initial encounter  I suspect the patient's symptoms are likely related to the amount of alcohol that he drinks.  He also does not hydrate well and recommended he have overall lifestyle changes.  Patient is agreeable to this.  Labs pending.  We will manage his minor penile abrasion with careful monitoring.  Patient is to be much more careful when he is using his clippers to shave.  Hunter BambergMario Yen Wandell, PA-C [facility] [phone] 10/31/2017  2:47 PM

## 2017-10-31 NOTE — Patient Instructions (Addendum)
Please try to cut back on your alcohol use and if you are able to cut it out altogether over time.  I believe that this is by enlarge the source of your symptoms together with lack of hydration.  With respect to water, I do recommend that you drink half your weight in ounces of water.  This should be about 80 ounces of water daily for you.  I will let you know about your lab results.  For now I would not do anything aggressive to the abrasion on the penis.  Let me know if it ends up having any discharge, redness, swelling and we can see what else we need to do.    Health Maintenance, Male A healthy lifestyle and preventive care is important for your health and wellness. Ask your health care provider about what schedule of regular examinations is right for you. What should I know about weight and diet? Eat a Healthy Diet  Eat plenty of vegetables, fruits, whole grains, low-fat dairy products, and lean protein.  Do not eat a lot of foods high in solid fats, added sugars, or salt.  Maintain a Healthy Weight Regular exercise can help you achieve or maintain a healthy weight. You should:  Do at least 150 minutes of exercise each week. The exercise should increase your heart rate and make you sweat (moderate-intensity exercise).  Do strength-training exercises at least twice a week.  Watch Your Levels of Cholesterol and Blood Lipids  Have your blood tested for lipids and cholesterol every 5 years starting at 29 years of age. If you are at high risk for heart disease, you should start having your blood tested when you are 29 years old. You may need to have your cholesterol levels checked more often if: ? Your lipid or cholesterol levels are high. ? You are older than 29 years of age. ? You are at high risk for heart disease.  What should I know about cancer screening? Many types of cancers can be detected early and may often be prevented. Lung Cancer  You should be screened every year for  lung cancer if: ? You are a current smoker who has smoked for at least 30 years. ? You are a former smoker who has quit within the past 15 years.  Talk to your health care provider about your screening options, when you should start screening, and how often you should be screened.  Colorectal Cancer  Routine colorectal cancer screening usually begins at 29 years of age and should be repeated every 5-10 years until you are 29 years old. You may need to be screened more often if early forms of precancerous polyps or small growths are found. Your health care provider may recommend screening at an earlier age if you have risk factors for colon cancer.  Your health care provider may recommend using home test kits to check for hidden blood in the stool.  A small camera at the end of a tube can be used to examine your colon (sigmoidoscopy or colonoscopy). This checks for the earliest forms of colorectal cancer.  Prostate and Testicular Cancer  Depending on your age and overall health, your health care provider may do certain tests to screen for prostate and testicular cancer.  Talk to your health care provider about any symptoms or concerns you have about testicular or prostate cancer.  Skin Cancer  Check your skin from head to toe regularly.  Tell your health care provider about any new moles or  changes in moles, especially if: ? There is a change in a mole's size, shape, or color. ? You have a mole that is larger than a pencil eraser.  Always use sunscreen. Apply sunscreen liberally and repeat throughout the day.  Protect yourself by wearing long sleeves, pants, a wide-brimmed hat, and sunglasses when outside.  What should I know about heart disease, diabetes, and high blood pressure?  If you are 18-69 years of age, have your blood pressure checked every 3-5 years. If you are 91 years of age or older, have your blood pressure checked every year. You should have your blood pressure  measured twice-once when you are at a hospital or clinic, and once when you are not at a hospital or clinic. Record the average of the two measurements. To check your blood pressure when you are not at a hospital or clinic, you can use: ? An automated blood pressure machine at a pharmacy. ? A home blood pressure monitor.  Talk to your health care provider about your target blood pressure.  If you are between 25-55 years old, ask your health care provider if you should take aspirin to prevent heart disease.  Have regular diabetes screenings by checking your fasting blood sugar level. ? If you are at a normal weight and have a low risk for diabetes, have this test once every three years after the age of 61. ? If you are overweight and have a high risk for diabetes, consider being tested at a younger age or more often.  A one-time screening for abdominal aortic aneurysm (AAA) by ultrasound is recommended for men aged 65-75 years who are current or former smokers. What should I know about preventing infection? Hepatitis B If you have a higher risk for hepatitis B, you should be screened for this virus. Talk with your health care provider to find out if you are at risk for hepatitis B infection. Hepatitis C Blood testing is recommended for:  Everyone born from [birth year] through 1967.  Anyone with known risk factors for hepatitis C.  Sexually Transmitted Diseases (STDs)  You should be screened each year for STDs including gonorrhea and chlamydia if: ? You are sexually active and are younger than 29 years of age. ? You are older than 29 years of age and your health care provider tells you that you are at risk for this type of infection. ? Your sexual activity has changed since you were last screened and you are at an increased risk for chlamydia or gonorrhea. Ask your health care provider if you are at risk.  Talk with your health care provider about whether you are at high risk of being infected  with HIV. Your health care provider may recommend a prescription medicine to help prevent HIV infection.  What else can I do?  Schedule regular health, dental, and eye exams.  Stay current with your vaccines (immunizations).  Do not use any tobacco products, such as cigarettes, chewing tobacco, and e-cigarettes. If you need help quitting, ask your health care provider.  Limit alcohol intake to no more than 2 drinks per day. One drink equals 12 ounces of beer, 5 ounces of wine, or 1 ounces of hard liquor.  Do not use street drugs.  Do not share needles.  Ask your health care provider for help if you need support or information about quitting drugs.  Tell your health care provider if you often feel depressed.  Tell your health care provider if you have ever  been abused or do not feel safe at home. This information is not intended to replace advice given to you by your health care provider. Make sure you discuss any questions you have with your health care provider. Document Released: 11/13/2007 Document Revised: 01/14/2016 Document Reviewed: 02/18/2015 Elsevier Interactive Patient Education  2018 [employer]. Safe Sex Practicing safe sex means taking steps before and during sex to reduce your risk of:  Getting an STD (sexually transmitted disease).  Giving your partner an STD.  Unwanted pregnancy.  How can I practice safe sex?  To practice safe sex:  Limit your sexual partners to only one partner who is having sex with only you.  Avoid using alcohol and recreational drugs before having sex. These substances can affect your judgment.  Before having sex with a new partner: ? Talk to your partner about past partners, past STDs, and drug use. ? You and your partner should be screened for STDs and discuss the results with each other.  Check your body regularly for sores, blisters, rashes, or unusual discharge. If you notice any of these problems, visit your health care  provider.  If you have symptoms of an infection or you are being treated for an STD, avoid sexual contact.  While having sex, use a condom. Make sure to: ? Use a condom every time you have vaginal, oral, or anal sex. Both females and males should wear condoms during oral sex. ? Keep condoms in place from the beginning to the end of sexual activity. ? Use a latex condom, if possible. Latex condoms offer the best protection. ? Use only water-based lubricants or oils to lubricate a condom. Using petroleum-based lubricants or oils will weaken the condom and increase the chance that it will break.  See your health care provider for regular screenings, exams, and tests for STDs.  Talk with your health care provider about the form of birth control (contraception) that is best for you.  Get vaccinated against hepatitis B and human papillomavirus (HPV).  If you are at risk of being infected with HIV (human immunodeficiency virus), talk with your health care provider about taking a prescription medicine to prevent HIV infection. You are considered at risk for HIV if: ? You are a man who has sex with other men. ? You are a heterosexual man or woman who is sexually active with more than one partner. ? You take drugs by injection. ? You are sexually active with a partner who has HIV.  This information is not intended to replace advice given to you by your health care provider. Make sure you discuss any questions you have with your health care provider. Document Released: 06/24/2004 Document Revised: 10/01/2015 Document Reviewed: 04/06/2015 Elsevier Interactive Patient Education  2018 [employer].     IF you received an x-ray today, you will receive an invoice from [facility]. Please contact [facility] at [phone] with questions or concerns regarding your invoice.   IF you received labwork today, you will receive an invoice from [facility]. Please contact [facility] at  [phone] with questions or concerns regarding your invoice.   Our billing staff will not be able to assist you with questions regarding bills from these companies.  You will be contacted with the lab results as soon as they are available. The fastest way to get your results is to activate your My Chart account. Instructions are located on the last page of this paperwork. If you have not heard from [place] regarding the results  in 2 weeks, please contact this office.

## 2017-11-01 LAB — GC/CHLAMYDIA PROBE AMP
Chlamydia trachomatis, NAA: NEGATIVE
NEISSERIA GONORRHOEAE BY PCR: NEGATIVE

## 2017-11-01 LAB — COMPREHENSIVE METABOLIC PANEL
ALBUMIN: 5 g/dL (ref 3.5–5.5)
ALT: 19 IU/L (ref 0–44)
AST: 21 IU/L (ref 0–40)
Albumin/Globulin Ratio: 1.6 (ref 1.2–2.2)
Alkaline Phosphatase: 86 IU/L (ref 39–117)
BILIRUBIN TOTAL: 0.5 mg/dL (ref 0.0–1.2)
BUN / CREAT RATIO: 9 (ref 9–20)
BUN: 10 mg/dL (ref 6–20)
CHLORIDE: 99 mmol/L (ref 96–106)
CO2: 24 mmol/L (ref 20–29)
Calcium: 9.7 mg/dL (ref 8.7–10.2)
Creatinine, Ser: 1.1 mg/dL (ref 0.76–1.27)
GFR calc Af Amer: 105 mL/min/{1.73_m2} (ref >59)
GFR calc non Af Amer: 91 mL/min/{1.73_m2} (ref >59)
GLOBULIN, TOTAL: 3.2 g/dL (ref 1.5–4.5)
Glucose: 87 mg/dL (ref 65–99)
Potassium: 4 mmol/L (ref 3.5–5.2)
SODIUM: 139 mmol/L (ref 134–144)
Total Protein: 8.2 g/dL (ref 6.0–8.5)

## 2017-11-01 LAB — CBC
HEMATOCRIT: 47.7 % (ref 37.5–51.0)
HEMOGLOBIN: 16 g/dL (ref 13.0–17.7)
MCH: 28.9 pg (ref 26.6–33.0)
MCHC: 33.5 g/dL (ref 31.5–35.7)
MCV: 86 fL (ref 79–97)
Platelets: 181 10*3/uL (ref 150–450)
RBC: 5.53 x10E6/uL (ref 4.14–5.80)
RDW: 14.1 % (ref 12.3–15.4)
WBC: 7.8 10*3/uL (ref 3.4–10.8)

## 2017-11-01 LAB — TRICHOMONAS VAGINALIS, PROBE AMP: Trich vag by NAA: NEGATIVE

## 2017-11-01 LAB — RPR: RPR Ser Ql: NONREACTIVE

## 2017-11-01 LAB — HIV ANTIBODY (ROUTINE TESTING W REFLEX): HIV Screen 4th Generation wRfx: NONREACTIVE

## 2017-11-07 ENCOUNTER — Encounter: Payer: Self-pay | Admitting: *Deleted

## 2018-05-12 ENCOUNTER — Encounter: Payer: Self-pay | Admitting: Family Medicine

## 2018-05-12 ENCOUNTER — Ambulatory Visit: Payer: Self-pay

## 2018-05-12 ENCOUNTER — Ambulatory Visit (INDEPENDENT_AMBULATORY_CARE_PROVIDER_SITE_OTHER): Payer: Self-pay | Admitting: Family Medicine

## 2018-05-12 ENCOUNTER — Other Ambulatory Visit: Payer: Self-pay

## 2018-05-12 VITALS — BP 134/83 | HR 72 | Temp 97.7°F | Ht 68.0 in | Wt 176.8 lb

## 2018-05-12 DIAGNOSIS — R310 Gross hematuria: Secondary | ICD-10-CM

## 2018-05-12 DIAGNOSIS — Z113 Encounter for screening for infections with a predominantly sexual mode of transmission: Secondary | ICD-10-CM

## 2018-05-12 DIAGNOSIS — R319 Hematuria, unspecified: Secondary | ICD-10-CM

## 2018-05-12 LAB — POCT URINALYSIS DIP (MANUAL ENTRY)
Bilirubin, UA: NEGATIVE
Glucose, UA: NEGATIVE mg/dL
Ketones, POC UA: NEGATIVE mg/dL
Leukocytes, UA: NEGATIVE
Nitrite, UA: NEGATIVE
Protein Ur, POC: NEGATIVE mg/dL
Spec Grav, UA: 1.025 (ref 1.010–1.025)
Urobilinogen, UA: 0.2 E.U./dL
pH, UA: 6.5 (ref 5.0–8.0)

## 2018-05-12 LAB — POC MICROSCOPIC URINALYSIS (UMFC): Mucus: ABSENT

## 2018-05-12 NOTE — Addendum Note (Signed)
Addended by: JOHNSON, SHAQUETTA D on: 05/12/2018 11:49 AM   Modules accepted: Orders  

## 2018-05-12 NOTE — Addendum Note (Signed)
Addended by: Baldwin CrownJOHNSON, Theadore Blunck D on: 05/12/2018 11:58 AM   Modules accepted: Orders

## 2018-05-12 NOTE — Addendum Note (Signed)
Addended by: Baldwin CrownJOHNSON, Ashvin Adelson D on: 05/12/2018 11:49 AM   Modules accepted: Orders

## 2018-05-12 NOTE — Addendum Note (Signed)
Addended by: Baldwin CrownJOHNSON, Correna Meacham D on: 05/12/2018 11:57 AM   Modules accepted: Orders

## 2018-05-12 NOTE — Progress Notes (Signed)
12/13/201910:25 AM  Rafael Bihariyler Cowher Aug 29, 1990, 27 y.o. male [id number]  Chief Complaint  Patient presents with  . Hematuria    for the past 1 wk. Has always had some type of stomach pain. Has has slimey blood coming frofm the penis before. Stomach has a grumbling sound all the ttime. Has seen GI in the past.    HPI:   Patient is a 27 y.o. male who presents today for gross hematuria  About a week for intermittent of blood clots Mostly during urination, seems to happen most related to sex Denies any other discharge No burning, no urgency or frequency No lymph nodes or penile lesions No flank pain no fever or chills Drinks "too much" trying to cut back No fhx kidney disease No rashes No changes in bowel function or GI symptoms Gross hematuria before in nov 2017, self resolved, no med care Does not smoke   Fall Risk  05/12/2018 02/16/2017  Falls in the past year? 0 No     [facility] 05/12/2018 10/31/2017 02/16/2017  Decreased Interest 0 0 2  Down, Depressed, Hopeless 0 0 2  PHQ - 2 Score 0 0 4  Altered sleeping - - 0  Tired, decreased energy - - 2  Change in appetite - - 0  Feeling bad or failure about yourself  - - 0  Trouble concentrating - - 3  Moving slowly or fidgety/restless - - 0  Suicidal thoughts - - 0  PHQ-9 Score - - 9    No Known Allergies  Prior to Admission medications   Not on File    Past Medical History:  Diagnosis Date  . Abdominal distention 07/31/2014  . ADHD (attention deficit hyperactivity disorder)   . Eczema   . 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 Oppositional defiant disorder     Past Surgical History:  Procedure Laterality Date  . COLONOSCOPY  10/2012   Dr. Jeani HawkingPatrick Hung  . right hand surgery  2014    Social History   Tobacco Use  . Smoking status: Never Smoker  . Smokeless tobacco: Never Used  . Tobacco comment: smokes weed  Substance Use Topics  . Alcohol use: Yes    Alcohol/week: 24.0  standard drinks    Types: 24 Cans of beer per week    Comment: liquor on the weekends    Family History  Problem Relation Age of Onset  . Heart disease Paternal Grandmother   . Hyperlipidemia Paternal Grandfather   . Cancer Mother [id number]       colon  . Cancer Paternal Aunt        brain tumor  . Stroke Paternal Aunt   . Cancer Maternal Grandfather        prostate    ROS Per hpi  OBJECTIVE:  Blood pressure 134/83, pulse 72, temperature 97.7 F (36.5 C), temperature source Oral, height 5\' 8"  (1.727 m), weight 176 lb 12.8 oz (80.2 kg), SpO2 100 %. Body mass index is 26.88 kg/m.   Physical Exam Vitals signs and nursing note reviewed.  Constitutional:      Appearance: He is well-developed.  HENT:     Head: Normocephalic and atraumatic.  Eyes:     Conjunctiva/sclera: Conjunctivae normal.     Pupils: Pupils are equal, round, and reactive to light.  Neck:     Musculoskeletal: Neck supple.  Cardiovascular:     Rate and Rhythm: Normal rate and regular rhythm.     Heart sounds: No murmur.  No friction rub. No gallop.   Pulmonary:     Effort: Pulmonary effort is normal.     Breath sounds: Normal breath sounds. No wheezing or rales.  Abdominal:     General: Bowel sounds are normal.     Palpations: Abdomen is soft.     Tenderness: There is no abdominal tenderness. There is no right CVA tenderness or left CVA tenderness.     Hernia: There is no hernia in the right inguinal area or left inguinal area.  Genitourinary:    Penis: Normal and circumcised. No erythema, discharge or lesions.      Scrotum/Testes: Normal.        Right: Mass or tenderness not present.        Left: Mass or tenderness not present.     Epididymis:     Right: Normal.     Left: Normal.  Skin:    General: Skin is warm and dry.  Neurological:     Mental Status: He is alert and oriented to person, place, and time.     Results for orders placed or performed in visit on 05/12/18 (from the past 24 hour(s))    POCT urinalysis dipstick     Status: Abnormal   Collection Time: 05/12/18 10:28 AM  Result Value Ref Range   Color, UA yellow yellow   Clarity, UA hazy (A) clear   Glucose, UA negative negative mg/dL   Bilirubin, UA negative negative   Ketones, POC UA negative negative mg/dL   Spec Grav, UA 1.610 9.604 - 1.025   Blood, UA large (A) negative   pH, UA 6.5 5.0 - 8.0   Protein Ur, POC negative negative mg/dL   Urobilinogen, UA 0.2 0.2 or 1.0 E.U./dL   Nitrite, UA Negative Negative   Leukocytes, UA Negative Negative  POCT Microscopic Urinalysis ([facility])     Status: Abnormal   Collection Time: 05/12/18 10:34 AM  Result Value Ref Range   WBC,UR,HPF,POC None None WBC/hpf   RBC,UR,HPF,POC Too numerous to count  (A) None RBC/hpf   Bacteria None None, Too numerous to count   Mucus Absent Absent   Epithelial Cells, UR Per Microscopy Few (A) None, Too numerous to count cells/hpf    Dg Abd 2 Views  Result Date: 05/12/2018 CLINICAL DATA:  Gross hematuria EXAM: ABDOMEN - 2 VIEW COMPARISON:  CT 12/19/2014 FINDINGS: Moderate stool burden. There is normal bowel gas pattern. No free air. No organomegaly or suspicious calcification. No acute bony abnormality. Lung bases clear. IMPRESSION: No visible suspicious calcification.  Moderate stool burden. Electronically Signed   By: Charlett Nose M.D.   On: 05/12/2018 11:00     ASSESSMENT and PLAN  1. Gross hematuria Unclear etiology, workup so far neg. Repeat instance. Referring to urology for further eval and treatment - POCT urinalysis dipstick - POCT Microscopic Urinalysis ([facility]) - Basic Metabolic Panel - DG Abd 2 Views; Future - Ambulatory referral to Urology - Urine Culture  2. Screen for STD (sexually transmitted disease) - HIV Antibody (routine testing w rflx) - RPR - HCV Ab w Reflex to Quant PCR - GC/Chlamydia Probe Amp([facility])    Return if symptoms worsen or fail to improve.    Myles Lipps, MD Primary Care at [facility] [address] [place], [place] [address] Ph.  [phone] Fax [phone]

## 2018-05-12 NOTE — Addendum Note (Signed)
Addended by: Baldwin CrownJOHNSON, Calandria Mullings D on: 05/12/2018 12:04 PM   Modules accepted: Orders

## 2018-05-12 NOTE — Addendum Note (Signed)
Addended by: JOHNSON, SHAQUETTA D on: 05/12/2018 12:04 PM   Modules accepted: Orders  

## 2018-05-12 NOTE — Addendum Note (Signed)
Addended by: Baldwin CrownJOHNSON, SHAQUETTA D on: 05/12/2018 12:28 PM   Modules accepted: Orders

## 2018-05-12 NOTE — Patient Instructions (Signed)
° ° ° °  If you have lab work done today you will be contacted with your lab results within the next 2 weeks.  If you have not heard from us then please contact us. The fastest way to get your results is to register for My Chart. ° ° °IF you received an x-ray today, you will receive an invoice from [facility]. Please contact [facility] at [phone] with questions or concerns regarding your invoice.  ° °IF you received labwork today, you will receive an invoice from [facility]. Please contact [facility] at [phone] with questions or concerns regarding your invoice.  ° °Our billing staff will not be able to assist you with questions regarding bills from these companies. ° °You will be contacted with the lab results as soon as they are available. The fastest way to get your results is to activate your My Chart account. Instructions are located on the last page of this paperwork. If you have not heard from us regarding the results in 2 weeks, please contact this office. °  ° ° ° °

## 2018-05-13 LAB — BASIC METABOLIC PANEL
BUN/Creatinine Ratio: 12 (ref 9–20)
BUN: 14 mg/dL (ref 6–20)
CO2: 26 mmol/L (ref 20–29)
Calcium: 9.6 mg/dL (ref 8.7–10.2)
Chloride: 100 mmol/L (ref 96–106)
Creatinine, Ser: 1.17 mg/dL (ref 0.76–1.27)
GFR calc Af Amer: 97 mL/min/{1.73_m2} (ref >59)
GFR calc non Af Amer: 84 mL/min/{1.73_m2} (ref >59)
Glucose: 81 mg/dL (ref 65–99)
Potassium: 4.3 mmol/L (ref 3.5–5.2)
Sodium: 140 mmol/L (ref 134–144)

## 2018-05-13 LAB — HIV ANTIBODY (ROUTINE TESTING W REFLEX): HIV Screen 4th Generation wRfx: NONREACTIVE

## 2018-05-13 LAB — HEPATITIS C ANTIBODY: Hep C Virus Ab: <0.1 s/co ratio (ref 0.0–0.9)

## 2018-05-13 LAB — RPR: RPR Ser Ql: NONREACTIVE

## 2018-05-14 LAB — URINE CULTURE: Organism ID, Bacteria: NO GROWTH

## 2018-05-16 LAB — GC/CHLAMYDIA PROBE AMP
Chlamydia trachomatis, NAA: NEGATIVE
Neisseria gonorrhoeae by PCR: NEGATIVE

## 2019-02-09 ENCOUNTER — Other Ambulatory Visit: Payer: Self-pay

## 2019-02-09 DIAGNOSIS — Z20822 Contact with and (suspected) exposure to covid-19: Secondary | ICD-10-CM

## 2019-02-11 LAB — NOVEL CORONAVIRUS, NAA: SARS-CoV-2, NAA: NOT DETECTED

## 2019-04-11 ENCOUNTER — Ambulatory Visit: Payer: Self-pay | Admitting: Emergency Medicine

## 2022-02-03 ENCOUNTER — Encounter: Payer: Self-pay | Admitting: Internal Medicine

## 2022-03-09 ENCOUNTER — Encounter: Payer: Self-pay | Admitting: Internal Medicine

## 2022-03-22 ENCOUNTER — Encounter: Payer: Self-pay | Admitting: Internal Medicine

## 2023-07-26 ENCOUNTER — Encounter: Payer: Self-pay | Admitting: Internal Medicine
